# Patient Record
Sex: Female | Born: 1955 | Race: White | Hispanic: No | Marital: Married | Smoking: Former smoker
Health system: Southern US, Community
[De-identification: ages and names within clinical notes are randomized; demographics above are authoritative.]

## PROBLEM LIST (undated history)

## (undated) DIAGNOSIS — I10 Essential (primary) hypertension: Secondary | ICD-10-CM

## (undated) DIAGNOSIS — E7800 Pure hypercholesterolemia, unspecified: Secondary | ICD-10-CM

## (undated) HISTORY — PX: TUBAL LIGATION: SHX77

---

## 2001-10-21 ENCOUNTER — Ambulatory Visit (HOSPITAL_COMMUNITY): Admission: RE | Admit: 2001-10-21 | Discharge: 2001-10-21 | Payer: Self-pay | Admitting: *Deleted

## 2001-10-21 ENCOUNTER — Encounter: Payer: Self-pay | Admitting: Specialist

## 2002-11-08 ENCOUNTER — Encounter: Payer: Self-pay | Admitting: Obstetrics and Gynecology

## 2002-11-08 ENCOUNTER — Ambulatory Visit (HOSPITAL_COMMUNITY): Admission: RE | Admit: 2002-11-08 | Discharge: 2002-11-08 | Payer: Self-pay | Admitting: Obstetrics and Gynecology

## 2002-11-15 ENCOUNTER — Ambulatory Visit (HOSPITAL_COMMUNITY): Admission: RE | Admit: 2002-11-15 | Discharge: 2002-11-15 | Payer: Self-pay | Admitting: Obstetrics and Gynecology

## 2002-11-15 ENCOUNTER — Encounter: Payer: Self-pay | Admitting: Obstetrics and Gynecology

## 2004-04-02 ENCOUNTER — Ambulatory Visit (HOSPITAL_COMMUNITY): Admission: RE | Admit: 2004-04-02 | Discharge: 2004-04-02 | Payer: Self-pay | Admitting: Obstetrics and Gynecology

## 2005-06-24 ENCOUNTER — Ambulatory Visit (HOSPITAL_COMMUNITY): Admission: RE | Admit: 2005-06-24 | Discharge: 2005-06-24 | Payer: Self-pay | Admitting: Obstetrics and Gynecology

## 2005-07-06 ENCOUNTER — Encounter: Admission: RE | Admit: 2005-07-06 | Discharge: 2005-07-06 | Payer: Self-pay | Admitting: Obstetrics and Gynecology

## 2006-09-02 ENCOUNTER — Ambulatory Visit (HOSPITAL_COMMUNITY): Admission: RE | Admit: 2006-09-02 | Discharge: 2006-09-02 | Payer: Self-pay | Admitting: Obstetrics and Gynecology

## 2007-11-01 ENCOUNTER — Ambulatory Visit (HOSPITAL_COMMUNITY): Admission: RE | Admit: 2007-11-01 | Discharge: 2007-11-01 | Payer: Self-pay | Admitting: Obstetrics and Gynecology

## 2009-01-14 ENCOUNTER — Ambulatory Visit (HOSPITAL_COMMUNITY): Admission: RE | Admit: 2009-01-14 | Discharge: 2009-01-14 | Payer: Self-pay | Admitting: Obstetrics and Gynecology

## 2010-04-21 ENCOUNTER — Ambulatory Visit (HOSPITAL_COMMUNITY): Admission: RE | Admit: 2010-04-21 | Discharge: 2010-04-21 | Payer: Self-pay | Admitting: Obstetrics & Gynecology

## 2011-05-19 ENCOUNTER — Other Ambulatory Visit: Payer: Self-pay | Admitting: Obstetrics & Gynecology

## 2011-05-19 DIAGNOSIS — Z139 Encounter for screening, unspecified: Secondary | ICD-10-CM

## 2011-06-01 ENCOUNTER — Ambulatory Visit (HOSPITAL_COMMUNITY)
Admission: RE | Admit: 2011-06-01 | Discharge: 2011-06-01 | Disposition: A | Discharge status: Home / Self Care | Payer: BC Managed Care – PPO | Source: Ambulatory Visit | Attending: Obstetrics & Gynecology | Admitting: Obstetrics & Gynecology

## 2011-06-01 DIAGNOSIS — Z139 Encounter for screening, unspecified: Secondary | ICD-10-CM

## 2011-06-01 DIAGNOSIS — Z1231 Encounter for screening mammogram for malignant neoplasm of breast: Secondary | ICD-10-CM | POA: Insufficient documentation

## 2011-10-14 ENCOUNTER — Encounter (INDEPENDENT_AMBULATORY_CARE_PROVIDER_SITE_OTHER): Payer: BC Managed Care – PPO | Admitting: Internal Medicine

## 2011-10-27 ENCOUNTER — Other Ambulatory Visit (INDEPENDENT_AMBULATORY_CARE_PROVIDER_SITE_OTHER): Payer: Self-pay | Admitting: *Deleted

## 2011-10-27 DIAGNOSIS — Z1211 Encounter for screening for malignant neoplasm of colon: Secondary | ICD-10-CM

## 2011-11-18 ENCOUNTER — Telehealth (INDEPENDENT_AMBULATORY_CARE_PROVIDER_SITE_OTHER): Payer: Self-pay | Admitting: *Deleted

## 2011-11-18 NOTE — Telephone Encounter (Signed)
Agree with colonoscopy.

## 2011-11-18 NOTE — Telephone Encounter (Signed)
PCP/Requesting MD:  Dwana Melena  Name: Cynthia Stephens  DOB: 07/06/2056  Home Phone: [phone]      Procedure: TCS  Reason/Indication:  SCREENING  Has patient had this procedure before?  NO  If so, when, by whom and where?    Is there a family history of colon cancer?  NO  Who?  What age when diagnosed?    Is patient diabetic?   NO      Does patient have prosthetic heart valve?  NO  Do you have a pacemaker?  NO  Has patient had joint replacement within last 12 months?  NO  Is patient on Coumadin, Plavix and/or Aspirin? NO  Medications: LISINOPRIL/HCTZ 20/25 MG DAILY, OSTEO BIFLEX DAILY, VITAMINS, CALTRATE, CALCIUM W/ VIT D  Allergies: NKDA  Pharmacy:   Medication Adjustment: NONE  Procedure date & time: 12/02/11 @ 9:45

## 2011-11-26 ENCOUNTER — Encounter (HOSPITAL_COMMUNITY): Payer: Self-pay | Admitting: Pharmacy Technician

## 2011-12-01 MED ORDER — SODIUM CHLORIDE 0.45 % IV SOLN
Freq: Once | INTRAVENOUS | Status: AC
  Administered 2011-12-02: 09:00:00 via INTRAVENOUS

## 2011-12-02 ENCOUNTER — Encounter (HOSPITAL_COMMUNITY): Payer: Self-pay | Admitting: *Deleted

## 2011-12-02 ENCOUNTER — Encounter (HOSPITAL_COMMUNITY)
Admission: RE | Disposition: A | Discharge status: Home / Self Care | Payer: Self-pay | Source: Ambulatory Visit | Attending: Internal Medicine

## 2011-12-02 ENCOUNTER — Ambulatory Visit (HOSPITAL_COMMUNITY)
Admission: RE | Admit: 2011-12-02 | Discharge: 2011-12-02 | Disposition: A | Discharge status: Home / Self Care | Payer: BC Managed Care – PPO | Source: Ambulatory Visit | Attending: Internal Medicine | Admitting: Internal Medicine

## 2011-12-02 DIAGNOSIS — Z1211 Encounter for screening for malignant neoplasm of colon: Secondary | ICD-10-CM

## 2011-12-02 DIAGNOSIS — K644 Residual hemorrhoidal skin tags: Secondary | ICD-10-CM | POA: Insufficient documentation

## 2011-12-02 HISTORY — DX: Pure hypercholesterolemia, unspecified: E78.00

## 2011-12-02 HISTORY — PX: COLONOSCOPY: SHX5424

## 2011-12-02 HISTORY — DX: Essential (primary) hypertension: I10

## 2011-12-02 SURGERY — COLONOSCOPY
Anesthesia: Moderate Sedation

## 2011-12-02 MED ORDER — MEPERIDINE HCL 50 MG/ML IJ SOLN
INTRAMUSCULAR | Status: AC
  Filled 2011-12-02: qty 1

## 2011-12-02 MED ORDER — MIDAZOLAM HCL 5 MG/5ML IJ SOLN
INTRAMUSCULAR | Status: AC
  Filled 2011-12-02: qty 10

## 2011-12-02 MED ORDER — MEPERIDINE HCL 50 MG/ML IJ SOLN
INTRAMUSCULAR | Status: DC | PRN
  Administered 2011-12-02 (×2): 25 mg via INTRAVENOUS

## 2011-12-02 MED ORDER — STERILE WATER FOR IRRIGATION IR SOLN
Status: DC | PRN
  Administered 2011-12-02: 09:00:00

## 2011-12-02 MED ORDER — MIDAZOLAM HCL 5 MG/5ML IJ SOLN
INTRAMUSCULAR | Status: DC | PRN
  Administered 2011-12-02 (×3): 2 mg via INTRAVENOUS

## 2011-12-02 NOTE — H&P (Signed)
Cynthia Stephens is an 55 y.o. female.   Chief Complaint: Patient is here for colonoscopy. HPI: Patient is 55 year old Caucasian female who is here for average risk screening colonoscopy. She has a good appetite and denies abdominal pain, change in her bowel habits or rectal bleeding. Family history is negative for CRC.  Past Medical History  Diagnosis Date  . Hypertension   . Hypercholesteremia     Past Surgical History  Procedure Date  . Tubal ligation     Family History  Problem Relation Age of Onset  . Colon cancer Neg Hx    Social History:  reports that she has quit smoking. She does not have any smokeless tobacco history on file. She reports that she drinks about 2 ounces of alcohol per week. She reports that she does not use illicit drugs.  Allergies: No Known Allergies  Medications Prior to Admission  Medication Dose Route Frequency Provider Last Rate Last Dose  . 0.45 % sodium chloride infusion   Intravenous Once Malissa Hippo, MD 20 mL/hr at 12/02/11 0858    . meperidine (DEMEROL) 50 MG/ML injection           . midazolam (VERSED) 5 MG/5ML injection            No current outpatient prescriptions on file as of 12/02/2011.    No results found for this or any previous visit (from the past 48 hour(s)). No results found.  Review of Systems  Constitutional: Negative for weight loss.  Gastrointestinal: Negative for abdominal pain, diarrhea, constipation, blood in stool and melena.    Blood pressure 130/80, pulse 92, temperature 97.8 F (36.6 C), temperature source Oral, resp. rate 24, height 5\' 3"  (1.6 m), weight 180 lb (81.647 kg), SpO2 99.00%. Physical Exam  Constitutional: She appears well-developed and well-nourished.  HENT:  Mouth/Throat: Oropharynx is clear and moist.  Eyes: Conjunctivae are normal. No scleral icterus.  Neck: No thyromegaly present.  Cardiovascular: Normal rate, regular rhythm and normal heart sounds.   No murmur heard. Respiratory: Effort  normal and breath sounds normal.  GI: Soft. She exhibits no distension and no mass. There is no tenderness.  Musculoskeletal: She exhibits no edema.  Lymphadenopathy:    She has no cervical adenopathy.  Neurological: She is alert.  Skin: Skin is warm and dry.     Assessment/Plan Average risk screening colonoscopy.  Christyanna Mckeon U 12/02/2011, 9:16 AM

## 2011-12-02 NOTE — Op Note (Signed)
COLONOSCOPY PROCEDURE REPORT  PATIENT:  Cynthia Stephens  MR#:  [id number] Birthdate:  October 12, 1956, 55 y.o., female Endoscopist:  Dr. Malissa Hippo, MD Referred By:  Dr. Dwana Melena, MD Procedure Date: 12/02/2011  Procedure:   Colonoscopy  Indications:  Patient is 55 year old Caucasian female who is undergoing average risk screening colonoscopy.  Informed Consent:  Procedure and risks were reviewed with the patient and informed consent obtained.  Medications:  Demerol 50 mg IV Versed 6 mg IV  Description of procedure:  After a digital rectal exam was performed, that colonoscope was advanced from the anus through the rectum and colon to the area of the cecum, ileocecal valve and appendiceal orifice. The cecum was deeply intubated. These structures were well-seen and photographed for the record. From the level of the cecum and ileocecal valve, the scope was slowly and cautiously withdrawn. The mucosal surfaces were carefully surveyed utilizing scope tip to flexion to facilitate fold flattening as needed. The scope was pulled down into the rectum where a thorough exam including retroflexion was performed.  Findings:   Prep excellent. Normal mucosa of the colon and rectum. Small hemorrhoids below the dentate line.  Therapeutic/Diagnostic Maneuvers Performed:  None  Complications:  None  Cecal Withdrawal Time:  11 minutes  Impression:  Normal colonoscopy except small external hemorrhoids.  Recommendations:  Standard instructions given. Next screening exam in 10 years.  Towana Stenglein U  12/02/2011 9:42 AM  CC: Dr. Dwana Melena, MD & Dr. Bonnetta Barry ref. provider found

## 2011-12-17 ENCOUNTER — Encounter (HOSPITAL_COMMUNITY): Payer: Self-pay | Admitting: Internal Medicine

## 2012-08-02 ENCOUNTER — Other Ambulatory Visit: Payer: Self-pay | Admitting: Obstetrics & Gynecology

## 2012-08-02 DIAGNOSIS — Z139 Encounter for screening, unspecified: Secondary | ICD-10-CM

## 2012-08-12 ENCOUNTER — Ambulatory Visit (HOSPITAL_COMMUNITY)
Admission: RE | Admit: 2012-08-12 | Discharge: 2012-08-12 | Disposition: A | Discharge status: Home / Self Care | Payer: BC Managed Care – PPO | Source: Ambulatory Visit | Attending: Obstetrics & Gynecology | Admitting: Obstetrics & Gynecology

## 2012-08-12 DIAGNOSIS — Z1231 Encounter for screening mammogram for malignant neoplasm of breast: Secondary | ICD-10-CM | POA: Insufficient documentation

## 2012-08-12 DIAGNOSIS — Z139 Encounter for screening, unspecified: Secondary | ICD-10-CM

## 2013-10-19 ENCOUNTER — Other Ambulatory Visit: Payer: Self-pay | Admitting: Adult Health

## 2013-10-19 ENCOUNTER — Other Ambulatory Visit: Payer: Self-pay | Admitting: Obstetrics & Gynecology

## 2013-10-19 DIAGNOSIS — Z139 Encounter for screening, unspecified: Secondary | ICD-10-CM

## 2013-10-24 ENCOUNTER — Ambulatory Visit (HOSPITAL_COMMUNITY)
Admission: RE | Admit: 2013-10-24 | Discharge: 2013-10-24 | Disposition: A | Discharge status: Home / Self Care | Payer: BC Managed Care – PPO | Source: Ambulatory Visit | Attending: Adult Health | Admitting: Adult Health

## 2013-10-24 DIAGNOSIS — Z139 Encounter for screening, unspecified: Secondary | ICD-10-CM

## 2013-10-24 DIAGNOSIS — Z1231 Encounter for screening mammogram for malignant neoplasm of breast: Secondary | ICD-10-CM | POA: Insufficient documentation

## 2015-03-11 ENCOUNTER — Other Ambulatory Visit: Payer: Self-pay | Admitting: Adult Health

## 2015-03-11 DIAGNOSIS — Z1231 Encounter for screening mammogram for malignant neoplasm of breast: Secondary | ICD-10-CM

## 2015-03-25 ENCOUNTER — Ambulatory Visit (HOSPITAL_COMMUNITY)
Admission: RE | Admit: 2015-03-25 | Discharge: 2015-03-25 | Disposition: A | Discharge status: Home / Self Care | Payer: BC Managed Care – PPO | Source: Ambulatory Visit | Attending: Adult Health | Admitting: Adult Health

## 2015-03-25 DIAGNOSIS — Z1231 Encounter for screening mammogram for malignant neoplasm of breast: Secondary | ICD-10-CM | POA: Insufficient documentation

## 2015-05-01 ENCOUNTER — Ambulatory Visit (HOSPITAL_COMMUNITY): Payer: BC Managed Care – PPO

## 2016-10-12 ENCOUNTER — Other Ambulatory Visit (HOSPITAL_COMMUNITY): Payer: Self-pay | Admitting: Internal Medicine

## 2016-10-12 DIAGNOSIS — Z1231 Encounter for screening mammogram for malignant neoplasm of breast: Secondary | ICD-10-CM

## 2016-10-12 DIAGNOSIS — Z13820 Encounter for screening for osteoporosis: Secondary | ICD-10-CM

## 2016-10-26 ENCOUNTER — Ambulatory Visit (HOSPITAL_COMMUNITY)
Admission: RE | Admit: 2016-10-26 | Discharge: 2016-10-26 | Disposition: A | Discharge status: Home / Self Care | Payer: BC Managed Care – PPO | Source: Ambulatory Visit | Attending: Internal Medicine | Admitting: Internal Medicine

## 2016-10-26 DIAGNOSIS — Z1231 Encounter for screening mammogram for malignant neoplasm of breast: Secondary | ICD-10-CM

## 2017-05-20 ENCOUNTER — Other Ambulatory Visit (HOSPITAL_COMMUNITY): Payer: BC Managed Care – PPO

## 2017-05-27 ENCOUNTER — Other Ambulatory Visit (HOSPITAL_COMMUNITY): Payer: BC Managed Care – PPO

## 2017-06-16 ENCOUNTER — Other Ambulatory Visit (HOSPITAL_COMMUNITY): Payer: BC Managed Care – PPO

## 2017-06-30 ENCOUNTER — Ambulatory Visit (HOSPITAL_COMMUNITY)
Admission: RE | Admit: 2017-06-30 | Discharge: 2017-06-30 | Disposition: A | Discharge status: Home / Self Care | Payer: BC Managed Care – PPO | Source: Ambulatory Visit | Attending: Internal Medicine | Admitting: Internal Medicine

## 2017-06-30 DIAGNOSIS — Z13820 Encounter for screening for osteoporosis: Secondary | ICD-10-CM | POA: Insufficient documentation

## 2017-06-30 DIAGNOSIS — Z780 Asymptomatic menopausal state: Secondary | ICD-10-CM | POA: Diagnosis not present

## 2017-06-30 DIAGNOSIS — M8580 Other specified disorders of bone density and structure, unspecified site: Secondary | ICD-10-CM | POA: Insufficient documentation

## 2018-03-24 ENCOUNTER — Other Ambulatory Visit (HOSPITAL_COMMUNITY): Payer: Self-pay | Admitting: Internal Medicine

## 2018-03-24 DIAGNOSIS — Z1231 Encounter for screening mammogram for malignant neoplasm of breast: Secondary | ICD-10-CM

## 2018-04-07 ENCOUNTER — Ambulatory Visit (HOSPITAL_COMMUNITY)
Admission: RE | Admit: 2018-04-07 | Discharge: 2018-04-07 | Disposition: A | Discharge status: Home / Self Care | Payer: BC Managed Care – PPO | Source: Ambulatory Visit | Attending: Internal Medicine | Admitting: Internal Medicine

## 2018-04-07 DIAGNOSIS — Z1231 Encounter for screening mammogram for malignant neoplasm of breast: Secondary | ICD-10-CM | POA: Diagnosis present

## 2019-05-25 ENCOUNTER — Other Ambulatory Visit (HOSPITAL_COMMUNITY): Payer: Self-pay | Admitting: Internal Medicine

## 2019-05-25 DIAGNOSIS — Z1231 Encounter for screening mammogram for malignant neoplasm of breast: Secondary | ICD-10-CM

## 2019-06-01 ENCOUNTER — Ambulatory Visit (HOSPITAL_COMMUNITY)
Admission: RE | Admit: 2019-06-01 | Discharge: 2019-06-01 | Disposition: A | Discharge status: Home / Self Care | Payer: BC Managed Care – PPO | Source: Ambulatory Visit | Attending: Internal Medicine | Admitting: Internal Medicine

## 2019-06-01 ENCOUNTER — Other Ambulatory Visit: Payer: Self-pay

## 2019-06-01 DIAGNOSIS — Z1231 Encounter for screening mammogram for malignant neoplasm of breast: Secondary | ICD-10-CM | POA: Insufficient documentation

## 2019-12-25 ENCOUNTER — Other Ambulatory Visit: Payer: Self-pay

## 2019-12-25 ENCOUNTER — Ambulatory Visit: Payer: Medicare PPO | Attending: Internal Medicine

## 2019-12-25 DIAGNOSIS — Z20822 Contact with and (suspected) exposure to covid-19: Secondary | ICD-10-CM

## 2019-12-26 LAB — NOVEL CORONAVIRUS, NAA: SARS-CoV-2, NAA: NOT DETECTED

## 2020-01-13 ENCOUNTER — Ambulatory Visit: Payer: Medicare PPO | Attending: Internal Medicine

## 2020-01-13 DIAGNOSIS — Z23 Encounter for immunization: Secondary | ICD-10-CM | POA: Insufficient documentation

## 2020-01-13 NOTE — Progress Notes (Signed)
   Covid-19 Vaccination Clinic  Name:  Cynthia Stephens    MRN: [id number] DOB: 09/09/1956  01/13/2020  [name] was observed post Covid-19 immunization for 15 minutes without incidence. She was provided with Vaccine Information Sheet and instruction to access the V-Safe system.   [name] was instructed to call 911 with any severe reactions post vaccine: Marland Kitchen Difficulty breathing  . Swelling of your face and throat  . A fast heartbeat  . A bad rash all over your body  . Dizziness and weakness    Immunizations Administered    Name Date Dose VIS Date Route   Pfizer COVID-19 Vaccine 01/13/2020 10:09 AM 0.3 mL 12/01/2019 Intramuscular   Manufacturer: [employer], [employer]   Lot: [id number]   NDC: [address]-0

## 2020-01-23 ENCOUNTER — Ambulatory Visit: Payer: Medicare PPO

## 2020-01-30 ENCOUNTER — Ambulatory Visit: Payer: Medicare PPO

## 2020-02-01 ENCOUNTER — Ambulatory Visit: Payer: Medicare PPO

## 2020-02-03 ENCOUNTER — Ambulatory Visit: Payer: Medicare PPO | Attending: Internal Medicine

## 2020-02-03 DIAGNOSIS — Z23 Encounter for immunization: Secondary | ICD-10-CM

## 2020-02-03 NOTE — Progress Notes (Signed)
   Covid-19 Vaccination Clinic  Name:  RANEE PEASLEY    MRN: [id number] DOB: [birth year]  02/03/2020  Ms. Deoliveira was observed post Covid-19 immunization for 15 minutes without incidence. She was provided with Vaccine Information Sheet and instruction to access the V-Safe system.   Ms. Blattner was instructed to call 911 with any severe reactions post vaccine: Marland Kitchen Difficulty breathing  . Swelling of your face and throat  . A fast heartbeat  . A bad rash all over your body  . Dizziness and weakness    Immunizations Administered    Name Date Dose VIS Date Route   Pfizer COVID-19 Vaccine 02/03/2020 11:59 AM 0.3 mL 12/01/2019 Intramuscular   Manufacturer: [employer], [employer]   Lot: [id number]   NDC: [address]-1

## 2020-03-29 DIAGNOSIS — I10 Essential (primary) hypertension: Secondary | ICD-10-CM | POA: Diagnosis not present

## 2020-03-29 DIAGNOSIS — Z0000 Encounter for general adult medical examination without abnormal findings: Secondary | ICD-10-CM | POA: Diagnosis not present

## 2020-03-29 DIAGNOSIS — E785 Hyperlipidemia, unspecified: Secondary | ICD-10-CM | POA: Diagnosis not present

## 2020-03-29 DIAGNOSIS — E669 Obesity, unspecified: Secondary | ICD-10-CM | POA: Diagnosis not present

## 2020-03-29 DIAGNOSIS — Z23 Encounter for immunization: Secondary | ICD-10-CM | POA: Diagnosis not present

## 2020-03-29 DIAGNOSIS — E6609 Other obesity due to excess calories: Secondary | ICD-10-CM | POA: Diagnosis not present

## 2020-03-29 DIAGNOSIS — Z0001 Encounter for general adult medical examination with abnormal findings: Secondary | ICD-10-CM | POA: Diagnosis not present

## 2020-03-29 DIAGNOSIS — E782 Mixed hyperlipidemia: Secondary | ICD-10-CM | POA: Diagnosis not present

## 2020-03-29 DIAGNOSIS — R7301 Impaired fasting glucose: Secondary | ICD-10-CM | POA: Diagnosis not present

## 2020-04-02 DIAGNOSIS — E782 Mixed hyperlipidemia: Secondary | ICD-10-CM | POA: Diagnosis not present

## 2020-04-02 DIAGNOSIS — Z8619 Personal history of other infectious and parasitic diseases: Secondary | ICD-10-CM | POA: Diagnosis not present

## 2020-04-02 DIAGNOSIS — Z6831 Body mass index (BMI) 31.0-31.9, adult: Secondary | ICD-10-CM | POA: Diagnosis not present

## 2020-04-02 DIAGNOSIS — Z713 Dietary counseling and surveillance: Secondary | ICD-10-CM | POA: Diagnosis not present

## 2020-04-02 DIAGNOSIS — E785 Hyperlipidemia, unspecified: Secondary | ICD-10-CM | POA: Diagnosis not present

## 2020-04-02 DIAGNOSIS — Z136 Encounter for screening for cardiovascular disorders: Secondary | ICD-10-CM | POA: Diagnosis not present

## 2020-04-02 DIAGNOSIS — E669 Obesity, unspecified: Secondary | ICD-10-CM | POA: Diagnosis not present

## 2020-04-02 DIAGNOSIS — Z0001 Encounter for general adult medical examination with abnormal findings: Secondary | ICD-10-CM | POA: Diagnosis not present

## 2020-04-02 DIAGNOSIS — R7301 Impaired fasting glucose: Secondary | ICD-10-CM | POA: Diagnosis not present

## 2020-04-02 DIAGNOSIS — Z6835 Body mass index (BMI) 35.0-35.9, adult: Secondary | ICD-10-CM | POA: Diagnosis not present

## 2020-04-02 DIAGNOSIS — Z23 Encounter for immunization: Secondary | ICD-10-CM | POA: Diagnosis not present

## 2020-04-02 DIAGNOSIS — I10 Essential (primary) hypertension: Secondary | ICD-10-CM | POA: Diagnosis not present

## 2020-08-29 ENCOUNTER — Ambulatory Visit: Payer: Medicare PPO | Attending: Internal Medicine

## 2020-08-29 DIAGNOSIS — Z23 Encounter for immunization: Secondary | ICD-10-CM

## 2020-08-29 NOTE — Progress Notes (Signed)
   Covid-19 Vaccination Clinic  Name:  Cynthia Stephens    MRN: [id number] DOB: [birth year]  08/29/2020  Cynthia Stephens was observed post Covid-19 immunization for 15 minutes without incident. She was provided with Vaccine Information Sheet and instruction to access the V-Safe system.   Cynthia Stephens was instructed to call 911 with any severe reactions post vaccine: Marland Kitchen Difficulty breathing  . Swelling of face and throat  . A fast heartbeat  . A bad rash all over body  . Dizziness and weakness

## 2020-10-10 DIAGNOSIS — E782 Mixed hyperlipidemia: Secondary | ICD-10-CM | POA: Diagnosis not present

## 2020-10-10 DIAGNOSIS — Z0001 Encounter for general adult medical examination with abnormal findings: Secondary | ICD-10-CM | POA: Diagnosis not present

## 2020-10-10 DIAGNOSIS — Z0000 Encounter for general adult medical examination without abnormal findings: Secondary | ICD-10-CM | POA: Diagnosis not present

## 2020-10-10 DIAGNOSIS — Z136 Encounter for screening for cardiovascular disorders: Secondary | ICD-10-CM | POA: Diagnosis not present

## 2020-10-10 DIAGNOSIS — E669 Obesity, unspecified: Secondary | ICD-10-CM | POA: Diagnosis not present

## 2020-10-10 DIAGNOSIS — E785 Hyperlipidemia, unspecified: Secondary | ICD-10-CM | POA: Diagnosis not present

## 2020-10-10 DIAGNOSIS — Z8619 Personal history of other infectious and parasitic diseases: Secondary | ICD-10-CM | POA: Diagnosis not present

## 2020-10-10 DIAGNOSIS — I10 Essential (primary) hypertension: Secondary | ICD-10-CM | POA: Diagnosis not present

## 2020-10-10 DIAGNOSIS — Z6831 Body mass index (BMI) 31.0-31.9, adult: Secondary | ICD-10-CM | POA: Diagnosis not present

## 2020-10-16 DIAGNOSIS — Z23 Encounter for immunization: Secondary | ICD-10-CM | POA: Diagnosis not present

## 2020-10-16 DIAGNOSIS — E669 Obesity, unspecified: Secondary | ICD-10-CM | POA: Diagnosis not present

## 2020-10-16 DIAGNOSIS — R7301 Impaired fasting glucose: Secondary | ICD-10-CM | POA: Diagnosis not present

## 2020-10-16 DIAGNOSIS — Z6830 Body mass index (BMI) 30.0-30.9, adult: Secondary | ICD-10-CM | POA: Diagnosis not present

## 2020-10-16 DIAGNOSIS — E782 Mixed hyperlipidemia: Secondary | ICD-10-CM | POA: Diagnosis not present

## 2020-10-16 DIAGNOSIS — I10 Essential (primary) hypertension: Secondary | ICD-10-CM | POA: Diagnosis not present

## 2020-12-04 DIAGNOSIS — H43811 Vitreous degeneration, right eye: Secondary | ICD-10-CM | POA: Diagnosis not present

## 2020-12-04 DIAGNOSIS — H353131 Nonexudative age-related macular degeneration, bilateral, early dry stage: Secondary | ICD-10-CM | POA: Diagnosis not present

## 2020-12-04 DIAGNOSIS — H33311 Horseshoe tear of retina without detachment, right eye: Secondary | ICD-10-CM | POA: Diagnosis not present

## 2021-01-24 ENCOUNTER — Ambulatory Visit (HOSPITAL_COMMUNITY)
Admission: RE | Admit: 2021-01-24 | Discharge: 2021-01-24 | Disposition: A | Discharge status: Home / Self Care | Payer: Medicare PPO | Source: Ambulatory Visit | Attending: Internal Medicine | Admitting: Internal Medicine

## 2021-01-24 ENCOUNTER — Other Ambulatory Visit: Payer: Self-pay

## 2021-01-24 ENCOUNTER — Other Ambulatory Visit (HOSPITAL_COMMUNITY): Payer: Self-pay | Admitting: Internal Medicine

## 2021-01-24 DIAGNOSIS — Z1231 Encounter for screening mammogram for malignant neoplasm of breast: Secondary | ICD-10-CM | POA: Diagnosis not present

## 2021-03-27 DIAGNOSIS — Z6835 Body mass index (BMI) 35.0-35.9, adult: Secondary | ICD-10-CM | POA: Diagnosis not present

## 2021-03-27 DIAGNOSIS — E785 Hyperlipidemia, unspecified: Secondary | ICD-10-CM | POA: Diagnosis not present

## 2021-03-27 DIAGNOSIS — I10 Essential (primary) hypertension: Secondary | ICD-10-CM | POA: Diagnosis not present

## 2021-03-27 DIAGNOSIS — E782 Mixed hyperlipidemia: Secondary | ICD-10-CM | POA: Diagnosis not present

## 2021-03-27 DIAGNOSIS — R7301 Impaired fasting glucose: Secondary | ICD-10-CM | POA: Diagnosis not present

## 2021-03-27 DIAGNOSIS — Z79899 Other long term (current) drug therapy: Secondary | ICD-10-CM | POA: Diagnosis not present

## 2021-03-27 DIAGNOSIS — E669 Obesity, unspecified: Secondary | ICD-10-CM | POA: Diagnosis not present

## 2021-04-02 ENCOUNTER — Ambulatory Visit: Payer: Medicare PPO | Attending: Internal Medicine

## 2021-04-02 DIAGNOSIS — Z23 Encounter for immunization: Secondary | ICD-10-CM

## 2021-04-02 DIAGNOSIS — Z6830 Body mass index (BMI) 30.0-30.9, adult: Secondary | ICD-10-CM | POA: Diagnosis not present

## 2021-04-02 DIAGNOSIS — R7301 Impaired fasting glucose: Secondary | ICD-10-CM | POA: Diagnosis not present

## 2021-04-02 DIAGNOSIS — I10 Essential (primary) hypertension: Secondary | ICD-10-CM | POA: Diagnosis not present

## 2021-04-02 DIAGNOSIS — E669 Obesity, unspecified: Secondary | ICD-10-CM | POA: Diagnosis not present

## 2021-04-02 DIAGNOSIS — E782 Mixed hyperlipidemia: Secondary | ICD-10-CM | POA: Diagnosis not present

## 2021-04-02 NOTE — Progress Notes (Signed)
   Covid-19 Vaccination Clinic  Name:  BRITTAN BUTTERBAUGH    MRN: [id number] DOB: 1956-10-20  04/02/2021  Ms. Reicks was observed post Covid-19 immunization for 15 minutes without incident. She was provided with Vaccine Information Sheet and instruction to access the V-Safe system.   Ms. Methvin was instructed to call 911 with any severe reactions post vaccine: Marland Kitchen Difficulty breathing  . Swelling of face and throat  . A fast heartbeat  . A bad rash all over body  . Dizziness and weakness   Immunizations Administered    Name Date Dose VIS Date Route   PFIZER Comrnaty(Gray TOP) Covid-19 Vaccine 04/02/2021 11:08 AM 0.3 mL 11/28/2020 Intramuscular   Manufacturer: [employer], [employer]   Lot: [id number]   NDC: [phone]

## 2021-04-07 ENCOUNTER — Other Ambulatory Visit (HOSPITAL_COMMUNITY): Payer: Self-pay

## 2021-04-07 MED ORDER — COVID-19 MRNA VAC-TRIS(PFIZER) 30 MCG/0.3ML IM SUSP
INTRAMUSCULAR | 0 refills | Status: DC
  Filled 2021-04-07: qty 0.3, 17d supply, fill #0

## 2021-04-08 ENCOUNTER — Other Ambulatory Visit (HOSPITAL_COMMUNITY): Payer: Self-pay

## 2021-04-09 DIAGNOSIS — H353131 Nonexudative age-related macular degeneration, bilateral, early dry stage: Secondary | ICD-10-CM | POA: Diagnosis not present

## 2021-04-09 DIAGNOSIS — H33311 Horseshoe tear of retina without detachment, right eye: Secondary | ICD-10-CM | POA: Diagnosis not present

## 2021-09-27 DIAGNOSIS — Z833 Family history of diabetes mellitus: Secondary | ICD-10-CM | POA: Diagnosis not present

## 2021-09-27 DIAGNOSIS — I10 Essential (primary) hypertension: Secondary | ICD-10-CM | POA: Diagnosis not present

## 2021-09-27 DIAGNOSIS — E669 Obesity, unspecified: Secondary | ICD-10-CM | POA: Diagnosis not present

## 2021-09-27 DIAGNOSIS — E785 Hyperlipidemia, unspecified: Secondary | ICD-10-CM | POA: Diagnosis not present

## 2021-09-27 DIAGNOSIS — Z87891 Personal history of nicotine dependence: Secondary | ICD-10-CM | POA: Diagnosis not present

## 2021-09-27 DIAGNOSIS — Z8249 Family history of ischemic heart disease and other diseases of the circulatory system: Secondary | ICD-10-CM | POA: Diagnosis not present

## 2021-09-27 DIAGNOSIS — M1990 Unspecified osteoarthritis, unspecified site: Secondary | ICD-10-CM | POA: Diagnosis not present

## 2021-10-02 DIAGNOSIS — E669 Obesity, unspecified: Secondary | ICD-10-CM | POA: Diagnosis not present

## 2021-10-02 DIAGNOSIS — Z0000 Encounter for general adult medical examination without abnormal findings: Secondary | ICD-10-CM | POA: Diagnosis not present

## 2021-10-02 DIAGNOSIS — E782 Mixed hyperlipidemia: Secondary | ICD-10-CM | POA: Diagnosis not present

## 2021-10-14 DIAGNOSIS — Z0001 Encounter for general adult medical examination with abnormal findings: Secondary | ICD-10-CM | POA: Diagnosis not present

## 2021-10-14 DIAGNOSIS — E782 Mixed hyperlipidemia: Secondary | ICD-10-CM | POA: Diagnosis not present

## 2021-10-14 DIAGNOSIS — Z23 Encounter for immunization: Secondary | ICD-10-CM | POA: Diagnosis not present

## 2021-10-14 DIAGNOSIS — I10 Essential (primary) hypertension: Secondary | ICD-10-CM | POA: Diagnosis not present

## 2021-10-14 DIAGNOSIS — R7301 Impaired fasting glucose: Secondary | ICD-10-CM | POA: Diagnosis not present

## 2021-10-14 DIAGNOSIS — E669 Obesity, unspecified: Secondary | ICD-10-CM | POA: Diagnosis not present

## 2021-10-15 ENCOUNTER — Other Ambulatory Visit (HOSPITAL_COMMUNITY): Payer: Self-pay | Admitting: Internal Medicine

## 2021-10-15 DIAGNOSIS — E669 Obesity, unspecified: Secondary | ICD-10-CM

## 2021-10-22 ENCOUNTER — Other Ambulatory Visit: Payer: Self-pay

## 2021-10-22 ENCOUNTER — Ambulatory Visit (HOSPITAL_COMMUNITY)
Admission: RE | Admit: 2021-10-22 | Discharge: 2021-10-22 | Disposition: A | Discharge status: Home / Self Care | Payer: Medicare PPO | Source: Ambulatory Visit | Attending: Internal Medicine | Admitting: Internal Medicine

## 2021-10-22 DIAGNOSIS — Z780 Asymptomatic menopausal state: Secondary | ICD-10-CM | POA: Diagnosis not present

## 2021-10-22 DIAGNOSIS — Z13820 Encounter for screening for osteoporosis: Secondary | ICD-10-CM | POA: Diagnosis not present

## 2021-10-22 DIAGNOSIS — E669 Obesity, unspecified: Secondary | ICD-10-CM | POA: Diagnosis not present

## 2021-10-22 DIAGNOSIS — M85851 Other specified disorders of bone density and structure, right thigh: Secondary | ICD-10-CM | POA: Insufficient documentation

## 2021-11-17 ENCOUNTER — Encounter (INDEPENDENT_AMBULATORY_CARE_PROVIDER_SITE_OTHER): Payer: Self-pay | Admitting: *Deleted

## 2022-01-07 DIAGNOSIS — R7301 Impaired fasting glucose: Secondary | ICD-10-CM | POA: Diagnosis not present

## 2022-01-07 DIAGNOSIS — E782 Mixed hyperlipidemia: Secondary | ICD-10-CM | POA: Diagnosis not present

## 2022-01-14 DIAGNOSIS — I10 Essential (primary) hypertension: Secondary | ICD-10-CM | POA: Diagnosis not present

## 2022-01-14 DIAGNOSIS — M85851 Other specified disorders of bone density and structure, right thigh: Secondary | ICD-10-CM | POA: Diagnosis not present

## 2022-01-14 DIAGNOSIS — E782 Mixed hyperlipidemia: Secondary | ICD-10-CM | POA: Diagnosis not present

## 2022-01-14 DIAGNOSIS — E669 Obesity, unspecified: Secondary | ICD-10-CM | POA: Diagnosis not present

## 2022-01-14 DIAGNOSIS — R7301 Impaired fasting glucose: Secondary | ICD-10-CM | POA: Diagnosis not present

## 2022-01-28 DIAGNOSIS — H43812 Vitreous degeneration, left eye: Secondary | ICD-10-CM | POA: Diagnosis not present

## 2022-02-12 ENCOUNTER — Encounter (INDEPENDENT_AMBULATORY_CARE_PROVIDER_SITE_OTHER): Payer: Self-pay | Admitting: *Deleted

## 2022-02-13 ENCOUNTER — Telehealth (INDEPENDENT_AMBULATORY_CARE_PROVIDER_SITE_OTHER): Payer: Self-pay | Admitting: *Deleted

## 2022-02-13 ENCOUNTER — Encounter (INDEPENDENT_AMBULATORY_CARE_PROVIDER_SITE_OTHER): Payer: Self-pay | Admitting: *Deleted

## 2022-02-13 ENCOUNTER — Other Ambulatory Visit (INDEPENDENT_AMBULATORY_CARE_PROVIDER_SITE_OTHER): Payer: Self-pay

## 2022-02-13 DIAGNOSIS — Z1211 Encounter for screening for malignant neoplasm of colon: Secondary | ICD-10-CM

## 2022-02-13 MED ORDER — PEG 3350-KCL-NA BICARB-NACL 420 G PO SOLR: 4000.0000 mL | Freq: Once | ORAL | 0 refills | Status: AC

## 2022-02-13 NOTE — Telephone Encounter (Signed)
Patient needs trilyte 

## 2022-02-19 ENCOUNTER — Telehealth (INDEPENDENT_AMBULATORY_CARE_PROVIDER_SITE_OTHER): Payer: Self-pay | Admitting: *Deleted

## 2022-02-19 NOTE — Telephone Encounter (Signed)
Referring MD/PCP: hall ? ?Procedure: tcs ? ?Reason/Indication:  screening ? ?Has patient had this procedure before?  Yes, 11/2011 ? If so, when, by whom and where?   ? ?Is there a family history of colon cancer?  no ? Who?  What age when diagnosed?   ? ?Is patient diabetic? If yes, Type 1 or Type 2   no ?     ?Does patient have prosthetic heart valve or mechanical valve?  no ? ?Do you have a pacemaker/defibrillator?  no ? ?Has patient ever had endocarditis/atrial fibrillation? no ? ?Does patient use oxygen? no ? ?Has patient had joint replacement within last 12 months?  no ? ?Is patient constipated or do they take laxatives? no ? ?Does patient have a history of alcohol/drug use?  no ? ?Have you had a stroke/heart attack last 6 mths? no ? ?Do you take medicine for weight loss?  no ? ?For female patients,: have you had a hysterectomy  ?                     are you post menopausal  ?                     do you still have your menstrual cycle no ? ?Is patient on blood thinner such as Coumadin, Plavix and/or Aspirin? no ? ?Medications: lisinopril/hctz 20/25 mg daily, rosuvastatin 10 mg daily, mvi daily ? ?Allergies nkda ? ?Medication Adjustment per Dr Rehman/Dr Levon Hedger  ? ?Procedure date & time: 03/18/22 ? ? ?

## 2022-03-11 NOTE — Patient Instructions (Signed)
?   Your procedure is scheduled on: 03/18/2022 ? Report to [facility] Main Entrance at     12:30 PM. ? Call this number if you have problems the morning of surgery: [phone] ? ? Remember: ? ?            Follow Directions on the letter you received from Your Physician's office regarding the Bowel Prep ? ?            No Smoking the day of Procedure : ? ? Take these medicines the morning of surgery with A SIP OF WATER: none ? ? Do not wear jewelry, make-up or nail polish. ?  ? Do not bring valuables to the hospital. ? Contacts, dentures or bridgework may not be worn into surgery. ? . ? ? Patients discharged the day of surgery will not be allowed to drive home. ?  ?  ?Colonoscopy, Adult, Care After ?This sheet gives you information about how to care for yourself after your procedure. Your health care provider may also give you more specific instructions. If you have problems or questions, contact your health care provider. ?What can I expect after the procedure? ?After the procedure, it is common to have: ?A small amount of blood in your stool for 24 hours after the procedure. ?Some gas. ?Mild abdominal cramping or bloating. ? ?Follow these instructions at home: ?General instructions ? ?For the first 24 hours after the procedure: ?Do not drive or use machinery. ?Do not sign important documents. ?Do not drink alcohol. ?Do your regular daily activities at a slower pace than normal. ?Eat soft, easy-to-digest foods. ?Rest often. ?Take over-the-counter or prescription medicines only as told by your health care provider. ?It is up to you to get the results of your procedure. Ask your health care provider, or the department performing the procedure, when your results will be ready. ?Relieving cramping and bloating ?Try walking around when you have cramps or feel bloated. ?Apply heat to your abdomen as told by your health care provider. Use a heat source that your health care provider recommends, such as a moist heat pack or  a heating pad. ?Place a towel between your skin and the heat source. ?Leave the heat on for 20-30 minutes. ?Remove the heat if your skin turns bright red. This is especially important if you are unable to feel pain, heat, or cold. You may have a greater risk of getting burned. ?Eating and drinking ?Drink enough fluid to keep your urine clear or pale yellow. ?Resume your normal diet as instructed by your health care provider. Avoid heavy or fried foods that are hard to digest. ?Avoid drinking alcohol for as long as instructed by your health care provider. ?Contact a health care provider if: ?You have blood in your stool 2-3 days after the procedure. ?Get help right away if: ?You have more than a small spotting of blood in your stool. ?You pass large blood clots in your stool. ?Your abdomen is swollen. ?You have nausea or vomiting. ?You have a fever. ?You have increasing abdominal pain that is not relieved with medicine. ?This information is not intended to replace advice given to you by your health care provider. Make sure you discuss any questions you have with your health care provider. ?Document Released: 07/21/2004 Document Revised: 08/31/2016 Document Reviewed: 02/18/2016 ?Elsevier Interactive Patient Education ? 2018 Elsevier Inc.  ?

## 2022-03-13 DIAGNOSIS — H353131 Nonexudative age-related macular degeneration, bilateral, early dry stage: Secondary | ICD-10-CM | POA: Diagnosis not present

## 2022-03-13 DIAGNOSIS — H43813 Vitreous degeneration, bilateral: Secondary | ICD-10-CM | POA: Diagnosis not present

## 2022-03-13 DIAGNOSIS — H33311 Horseshoe tear of retina without detachment, right eye: Secondary | ICD-10-CM | POA: Diagnosis not present

## 2022-03-16 ENCOUNTER — Encounter (HOSPITAL_COMMUNITY)
Admission: RE | Admit: 2022-03-16 | Discharge: 2022-03-16 | Disposition: A | Discharge status: Home / Self Care | Payer: Medicare PPO | Source: Ambulatory Visit | Attending: Internal Medicine | Admitting: Internal Medicine

## 2022-03-16 ENCOUNTER — Encounter (HOSPITAL_COMMUNITY): Payer: Self-pay

## 2022-03-16 DIAGNOSIS — Z01818 Encounter for other preprocedural examination: Secondary | ICD-10-CM | POA: Insufficient documentation

## 2022-03-16 DIAGNOSIS — Z1211 Encounter for screening for malignant neoplasm of colon: Secondary | ICD-10-CM | POA: Insufficient documentation

## 2022-03-16 LAB — BASIC METABOLIC PANEL
Anion gap: 8 (ref 5–15)
BUN: 27 mg/dL — ABNORMAL HIGH (ref 8–23)
CO2: 28 mmol/L (ref 22–32)
Calcium: 9 mg/dL (ref 8.9–10.3)
Chloride: 102 mmol/L (ref 98–111)
Creatinine, Ser: 0.77 mg/dL (ref 0.44–1.00)
GFR, Estimated: >60 mL/min (ref >60)
Glucose, Bld: 107 mg/dL — ABNORMAL HIGH (ref 70–99)
Potassium: 3.5 mmol/L (ref 3.5–5.1)
Sodium: 138 mmol/L (ref 135–145)

## 2022-03-18 ENCOUNTER — Ambulatory Visit (HOSPITAL_COMMUNITY): Payer: Medicare PPO | Admitting: Certified Registered Nurse Anesthetist

## 2022-03-18 ENCOUNTER — Ambulatory Visit (HOSPITAL_COMMUNITY)
Admission: RE | Admit: 2022-03-18 | Discharge: 2022-03-18 | Disposition: A | Discharge status: Home / Self Care | Payer: Medicare PPO | Attending: Internal Medicine | Admitting: Internal Medicine

## 2022-03-18 ENCOUNTER — Other Ambulatory Visit: Payer: Self-pay

## 2022-03-18 ENCOUNTER — Encounter (HOSPITAL_COMMUNITY): Payer: Self-pay | Admitting: Internal Medicine

## 2022-03-18 ENCOUNTER — Ambulatory Visit (HOSPITAL_BASED_OUTPATIENT_CLINIC_OR_DEPARTMENT_OTHER): Payer: Medicare PPO | Admitting: Certified Registered Nurse Anesthetist

## 2022-03-18 ENCOUNTER — Encounter (HOSPITAL_COMMUNITY)
Admission: RE | Disposition: A | Discharge status: Home / Self Care | Payer: Self-pay | Source: Home / Self Care | Attending: Internal Medicine

## 2022-03-18 DIAGNOSIS — K635 Polyp of colon: Secondary | ICD-10-CM | POA: Diagnosis not present

## 2022-03-18 DIAGNOSIS — K621 Rectal polyp: Secondary | ICD-10-CM

## 2022-03-18 DIAGNOSIS — Z1211 Encounter for screening for malignant neoplasm of colon: Secondary | ICD-10-CM

## 2022-03-18 DIAGNOSIS — I10 Essential (primary) hypertension: Secondary | ICD-10-CM | POA: Insufficient documentation

## 2022-03-18 DIAGNOSIS — Z87891 Personal history of nicotine dependence: Secondary | ICD-10-CM | POA: Insufficient documentation

## 2022-03-18 DIAGNOSIS — Z79899 Other long term (current) drug therapy: Secondary | ICD-10-CM | POA: Diagnosis not present

## 2022-03-18 DIAGNOSIS — K6289 Other specified diseases of anus and rectum: Secondary | ICD-10-CM

## 2022-03-18 DIAGNOSIS — K5730 Diverticulosis of large intestine without perforation or abscess without bleeding: Secondary | ICD-10-CM | POA: Diagnosis not present

## 2022-03-18 DIAGNOSIS — Z139 Encounter for screening, unspecified: Secondary | ICD-10-CM | POA: Diagnosis not present

## 2022-03-18 HISTORY — PX: COLONOSCOPY WITH PROPOFOL: SHX5780

## 2022-03-18 HISTORY — PX: BIOPSY: SHX5522

## 2022-03-18 LAB — HM COLONOSCOPY

## 2022-03-18 SURGERY — COLONOSCOPY WITH PROPOFOL
Anesthesia: General

## 2022-03-18 MED ORDER — PROPOFOL 10 MG/ML IV BOLUS
INTRAVENOUS | Status: DC | PRN
  Administered 2022-03-18: 60 mg via INTRAVENOUS
  Administered 2022-03-18: 40 mg via INTRAVENOUS

## 2022-03-18 MED ORDER — LACTATED RINGERS IV SOLN: INTRAVENOUS | Status: DC

## 2022-03-18 MED ORDER — PROPOFOL 500 MG/50ML IV EMUL
INTRAVENOUS | Status: DC | PRN
  Administered 2022-03-18: 150 ug/kg/min via INTRAVENOUS

## 2022-03-18 NOTE — H&P (Signed)
Cynthia Stephens is an 66 y.o. female.   ?Chief Complaint:   Patient is here for colonoscopy. ?HPI:   Patient is 66 year old Caucasian female who is here for screening colonoscopy.  Last exam was normal in December 2012.  She denies abdominal pain change in bowel habits or rectal bleeding.  Her appetite is good and her weight is stable.  She does not take aspirin or anticoagulants. ?  Family history is negative for CRC. ? ?Past Medical History:  ?Diagnosis Date  ? Hypercholesteremia   ? Hypertension   ? ? ?Past Surgical History:  ?Procedure Laterality Date  ? COLONOSCOPY  12/02/2011  ? Procedure: COLONOSCOPY;  Surgeon: Rogene Houston, MD;  Location: [facility];  Service: Endoscopy;  Laterality: N/A;  ? TUBAL LIGATION    ? ? ?Family History  ?Problem Relation Age of Onset  ? Colon cancer Neg Hx   ? ?Social History:  reports that she has quit smoking. Her smoking use included cigarettes. She has a 5.00 pack-year smoking history. She does not have any smokeless tobacco history on file. She reports current alcohol use of about 4.0 standard drinks per week. She reports that she does not use drugs. ? ?Allergies: No Known Allergies ? ?Medications Prior to Admission  ?Medication Sig Dispense Refill  ? acetaminophen (TYLENOL) 325 MG tablet Take 650 mg by mouth every 6 (six) hours as needed for moderate pain.    ? CALCIUM PO Take 1 tablet by mouth daily.    ? lisinopril-hydrochlorothiazide (PRINZIDE,ZESTORETIC) 20-25 MG per tablet Take 1 tablet by mouth daily.      ? Multiple Vitamins-Minerals (MULTIVITAMINS THER. W/MINERALS) TABS Take 1 tablet by mouth daily.      ? rosuvastatin (CRESTOR) 10 MG tablet Take 10 mg by mouth daily.    ? COVID-19 mRNA Vac-TriS, Pfizer, SUSP injection Inject into the muscle. (Patient not taking: Reported on 03/11/2022) 0.3 mL 0  ? ? ?Results for orders placed or performed during the hospital encounter of 03/16/22 (from the past 48 hour(s))  ?Basic metabolic panel     Status: Abnormal  ? Collection  Time: 03/16/22 12:58 PM  ?Result Value Ref Range  ? Sodium 138 135 - 145 mmol/L  ? Potassium 3.5 3.5 - 5.1 mmol/L  ? Chloride 102 98 - 111 mmol/L  ? CO2 28 22 - 32 mmol/L  ? Glucose, Bld 107 (H) 70 - 99 mg/dL  ?  Comment: Glucose reference range applies only to samples taken after fasting for at least 8 hours.  ? BUN 27 (H) 8 - 23 mg/dL  ? Creatinine, Ser 0.77 0.44 - 1.00 mg/dL  ? Calcium 9.0 8.9 - 10.3 mg/dL  ? GFR, Estimated >60 >60 mL/min  ?  Comment: (NOTE) ?Calculated using the CKD-EPI Creatinine Equation (2021) ?  ? Anion gap 8 5 - 15  ?  Comment: Performed at [facility], [address], [place], [place] [address]  ? ?No results found. ? ?Review of Systems ? ?There were no vitals taken for this visit. ?Physical Exam ?HENT:  ?   Mouth/Throat:  ?   Mouth: Mucous membranes are moist.  ?   Pharynx: Oropharynx is clear.  ?Eyes:  ?   General: No scleral icterus. ?   Conjunctiva/sclera: Conjunctivae normal.  ?Cardiovascular:  ?   Rate and Rhythm: Normal rate and regular rhythm.  ?   Heart sounds: Normal heart sounds. No murmur heard. ?Pulmonary:  ?   Effort: Pulmonary effort is normal.  ?  Breath sounds: Normal breath sounds.  ?Abdominal:  ?   General: There is no distension.  ?   Palpations: Abdomen is soft. There is no mass.  ?   Tenderness: There is no abdominal tenderness.  ?Musculoskeletal:     ?   General: No swelling.  ?   Cervical back: Neck supple.  ?Lymphadenopathy:  ?   Cervical: No cervical adenopathy.  ?Skin: ?   General: Skin is warm and dry.  ?Neurological:  ?   Mental Status: She is alert.  ?  ? ?Assessment/Plan ? ?  Average risk screening colonoscopy ? ?Hildred Laser, MD ?03/18/2022, 12:48 PM ? ? ? ?

## 2022-03-18 NOTE — Op Note (Signed)
[facility] ?Patient Name: Cynthia Stephens ?Procedure Date: 03/18/2022 11:31 AM ?MRN: [id number] ?Date of Birth: 06-Nov-1956 ?Attending MD: Lionel December , MD ?CSN: [id number] ?Age: 66 ?Admit Type: Outpatient ?Procedure:                Colonoscopy ?Indications:              Screening for colorectal malignant neoplasm ?Providers:                Lionel December, MD, Jannett Celestine, RN, Durwin Glaze  ?                          Tech, Technician ?Referring MD:             Catalina Pizza, MD ?Medicines:                Propofol per Anesthesia ?Complications:            No immediate complications. ?Estimated Blood Loss:     Estimated blood loss was minimal. ?Procedure:                Pre-Anesthesia Assessment: ?                          - Prior to the procedure, a History and Physical  ?                          was performed, and patient medications and  ?                          allergies were reviewed. The patient's tolerance of  ?                          previous anesthesia was also reviewed. The risks  ?                          and benefits of the procedure and the sedation  ?                          options and risks were discussed with the patient.  ?                          All questions were answered, and informed consent  ?                          was obtained. Prior Anticoagulants: The patient has  ?                          taken no previous anticoagulant or antiplatelet  ?                          agents. ASA Grade Assessment: II - A patient with  ?                          mild systemic disease. After reviewing the risks  ?  and benefits, the patient was deemed in  ?                          satisfactory condition to undergo the procedure. ?                          After obtaining informed consent, the colonoscope  ?                          was passed under direct vision. Throughout the  ?                          procedure, the patient's blood pressure, pulse, and  ?                           oxygen saturations were monitored continuously. The  ?                          [id number] ([id number]) was introduced through the  ?                          anus and advanced to the the cecum, identified by  ?                          appendiceal orifice and ileocecal valve. The  ?                          colonoscopy was performed without difficulty. The  ?                          patient tolerated the procedure well. The quality  ?                          of the bowel preparation was good. The ileocecal  ?                          valve, appendiceal orifice, and rectum were  ?                          photographed. ?Scope In: 2:10:23 PM ?Scope Out: 2:31:49 PM ?Scope Withdrawal Time: 0 hours 11 minutes 8 seconds  ?Total Procedure Duration: 0 hours 21 minutes 26 seconds  ?Findings: ?     The perianal and digital rectal examinations were normal. ?     A few diverticula were found in the sigmoid colon. ?     Three polyps were found in the rectum and recto-sigmoid colon. The  ?     polyps were small in size. These were biopsied with a cold forceps for  ?     histology. The pathology specimen was placed into Bottle Number 1. ?     Anal papilla(e) were hypertrophied. ?     No additional abnormalities were found on retroflexion. ?Impression:               - Diverticulosis in the sigmoid colon. ?                          -  Three small polyps in the rectum and at the  ?                          recto-sigmoid colon. Biopsied. ?                          - Anal papilla(e) were hypertrophied. ?Moderate Sedation: ?     Per Anesthesia Care ?Recommendation:           - Patient has a contact number available for  ?                          emergencies. The signs and symptoms of potential  ?                          delayed complications were discussed with the  ?                          patient. Return to normal activities tomorrow.  ?                          Written discharge instructions were provided to the  ?                           patient. ?                          - High fiber diet today. ?                          - Continue present medications. ?                          - No aspirin, ibuprofen, naproxen, or other  ?                          non-steroidal anti-inflammatory drugs for 1 day. ?                          - Await pathology results. ?                          - Repeat colonoscopy is recommended. The  ?                          colonoscopy date will be determined after pathology  ?                          results from today's exam become available for  ?                          review. ?Procedure Code(s):        --- Professional --- ?                          [phone], Colonoscopy, flexible; with biopsy, single  ?  or multiple ?Diagnosis Code(s):        --- Professional --- ?                          Z12.11, Encounter for screening for malignant  ?                          neoplasm of colon ?                          K62.1, Rectal polyp ?                          K63.5, Polyp of colon ?                          K62.89, Other specified diseases of anus and rectum ?                          K57.30, Diverticulosis of large intestine without  ?                          perforation or abscess without bleeding ?CPT copyright 2019 American Medical Association. All rights reserved. ?The codes documented in this report are preliminary and upon coder review may  ?be revised to meet current compliance requirements. ?Lionel December, MD ?Lionel December, MD ?03/18/2022 2:41:09 PM ?This report has been signed electronically. ?Number of Addenda: 0 ?

## 2022-03-18 NOTE — Anesthesia Preprocedure Evaluation (Signed)
Anesthesia Evaluation  Patient identified by MRN, date of birth, ID band Patient awake    Reviewed: Allergy & Precautions, H&P , NPO status , Patient's Chart, lab work & pertinent test results, reviewed documented beta blocker date and time   Airway Mallampati: II  TM Distance: >3 FB Neck ROM: full    Dental no notable dental hx.    Pulmonary neg pulmonary ROS, former smoker,    Pulmonary exam normal breath sounds clear to auscultation       Cardiovascular Exercise Tolerance: Good hypertension, negative cardio ROS   Rhythm:regular Rate:Normal     Neuro/Psych negative neurological ROS  negative psych ROS   GI/Hepatic negative GI ROS, Neg liver ROS,   Endo/Other  negative endocrine ROS  Renal/GU negative Renal ROS  negative genitourinary   Musculoskeletal   Abdominal   Peds  Hematology negative hematology ROS (+)   Anesthesia Other Findings   Reproductive/Obstetrics negative OB ROS                             Anesthesia Physical Anesthesia Plan  ASA: 2  Anesthesia Plan: General   Post-op Pain Management:    Induction:   PONV Risk Score and Plan: Propofol infusion  Airway Management Planned:   Additional Equipment:   Intra-op Plan:   Post-operative Plan:   Informed Consent: I have reviewed the patients History and Physical, chart, labs and discussed the procedure including the risks, benefits and alternatives for the proposed anesthesia with the patient or authorized representative who has indicated his/her understanding and acceptance.     Dental Advisory Given  Plan Discussed with: CRNA  Anesthesia Plan Comments:         Anesthesia Quick Evaluation  

## 2022-03-18 NOTE — Transfer of Care (Signed)
Immediate Anesthesia Transfer of Care Note ? ?Patient: Cynthia Stephens ? ?Procedure(s) Performed: COLONOSCOPY WITH PROPOFOL ?BIOPSY ? ?Patient Location: PACU ? ?Anesthesia Type:General ? ?Level of Consciousness: awake, alert  and oriented ? ?Airway & Oxygen Therapy: Patient Spontanous Breathing ? ?Post-op Assessment: Report given to RN, Post -op Vital signs reviewed and stable, Patient moving all extremities X 4 and Patient able to stick tongue midline ? ?Post vital signs: Reviewed ? ?Last Vitals:  ?Vitals Value Taken Time  ?BP 111/59   ?Temp 36.6   ?Pulse 70   ?Resp 17   ?SpO2 98   ? ? ?Last Pain:  ?Vitals:  ? 03/18/22 1233  ?TempSrc: Oral  ?PainSc: 0-No pain  ?   ? ?Patients Stated Pain Goal: 7 (03/18/22 1233) ? ?Complications: No notable events documented. ?

## 2022-03-18 NOTE — Discharge Instructions (Addendum)
No aspirin or NSAIDs for 24 hours. Resume usual medications as before. High-fiber diet. No driving for 24 hours. Physician will call with biopsy results and further recommendations.   

## 2022-03-19 DIAGNOSIS — M25562 Pain in left knee: Secondary | ICD-10-CM | POA: Diagnosis not present

## 2022-03-19 DIAGNOSIS — M1712 Unilateral primary osteoarthritis, left knee: Secondary | ICD-10-CM | POA: Diagnosis not present

## 2022-03-19 NOTE — Anesthesia Postprocedure Evaluation (Signed)
Anesthesia Post Note ? ?Patient: Cynthia Stephens ? ?Procedure(s) Performed: COLONOSCOPY WITH PROPOFOL ?BIOPSY ? ?Patient location during evaluation: Phase II ?Anesthesia Type: General ?Level of consciousness: awake ?Pain management: pain level controlled ?Vital Signs Assessment: post-procedure vital signs reviewed and stable ?Respiratory status: spontaneous breathing and respiratory function stable ?Cardiovascular status: blood pressure returned to baseline and stable ?Postop Assessment: no headache and no apparent nausea or vomiting ?Anesthetic complications: no ?Comments: Late entry ? ? ?No notable events documented. ? ? ?Last Vitals:  ?Vitals:  ? 03/18/22 1233 03/18/22 1434  ?BP: (!) 145/83 (!) 111/59  ?Pulse: 78 69  ?Resp: 20   ?Temp: 36.7 ?C 36.7 ?C  ?SpO2: 96% 98%  ?  ?Last Pain:  ?Vitals:  ? 03/18/22 1434  ?TempSrc: Oral  ?PainSc: 0-No pain  ? ? ?  ?  ?  ?  ?  ?  ? ?Windell [place] ? ? ? ? ?

## 2022-03-20 LAB — SURGICAL PATHOLOGY

## 2022-03-23 ENCOUNTER — Encounter (HOSPITAL_COMMUNITY): Payer: Self-pay | Admitting: Internal Medicine

## 2022-03-26 ENCOUNTER — Encounter (INDEPENDENT_AMBULATORY_CARE_PROVIDER_SITE_OTHER): Payer: Self-pay | Admitting: *Deleted

## 2022-04-08 DIAGNOSIS — E669 Obesity, unspecified: Secondary | ICD-10-CM | POA: Diagnosis not present

## 2022-04-08 DIAGNOSIS — R7301 Impaired fasting glucose: Secondary | ICD-10-CM | POA: Diagnosis not present

## 2022-04-08 DIAGNOSIS — M85851 Other specified disorders of bone density and structure, right thigh: Secondary | ICD-10-CM | POA: Diagnosis not present

## 2022-04-08 DIAGNOSIS — E782 Mixed hyperlipidemia: Secondary | ICD-10-CM | POA: Diagnosis not present

## 2022-04-29 DIAGNOSIS — M85851 Other specified disorders of bone density and structure, right thigh: Secondary | ICD-10-CM | POA: Diagnosis not present

## 2022-04-29 DIAGNOSIS — R7301 Impaired fasting glucose: Secondary | ICD-10-CM | POA: Diagnosis not present

## 2022-04-29 DIAGNOSIS — Z6835 Body mass index (BMI) 35.0-35.9, adult: Secondary | ICD-10-CM | POA: Diagnosis not present

## 2022-04-29 DIAGNOSIS — E669 Obesity, unspecified: Secondary | ICD-10-CM | POA: Diagnosis not present

## 2022-04-29 DIAGNOSIS — E782 Mixed hyperlipidemia: Secondary | ICD-10-CM | POA: Diagnosis not present

## 2022-04-29 DIAGNOSIS — I10 Essential (primary) hypertension: Secondary | ICD-10-CM | POA: Diagnosis not present

## 2022-05-06 IMAGING — MG MM DIGITAL SCREENING BILAT W/ TOMO AND CAD
8 series · 9 of 24 positions shown · non-contrast
Comparison: Previous exam(s).

CLINICAL DATA: Screening.

EXAM:
DIGITAL SCREENING BILATERAL MAMMOGRAM WITH TOMOSYNTHESIS AND CAD
TECHNIQUE: Bilateral screening digital craniocaudal and mediolateral oblique
mammograms were obtained. Bilateral screening digital breast
tomosynthesis was performed. The images were evaluated with
computer-aided detection.

[R CC synth-2D]
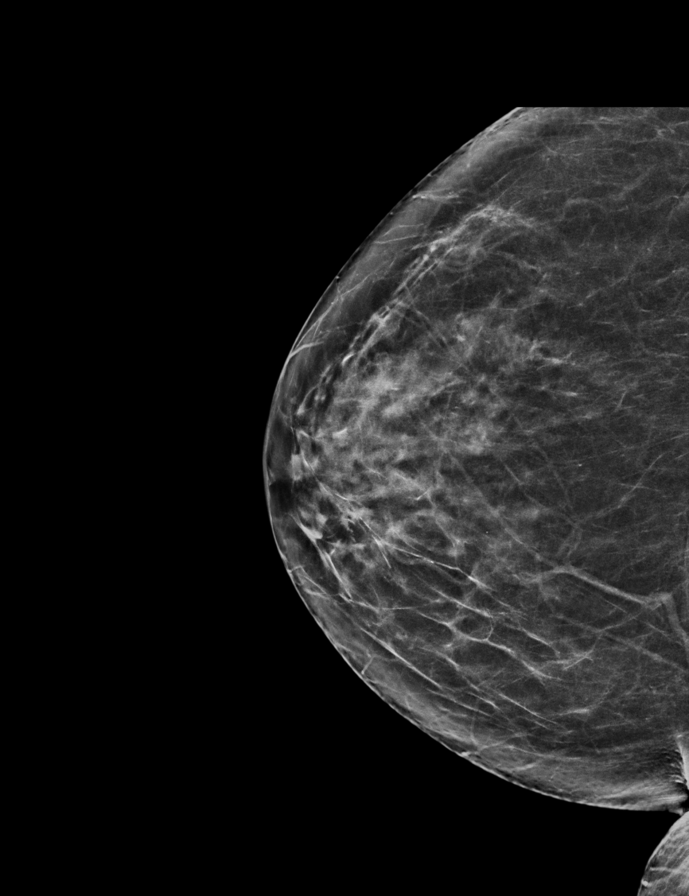

[R MLO synth-2D]
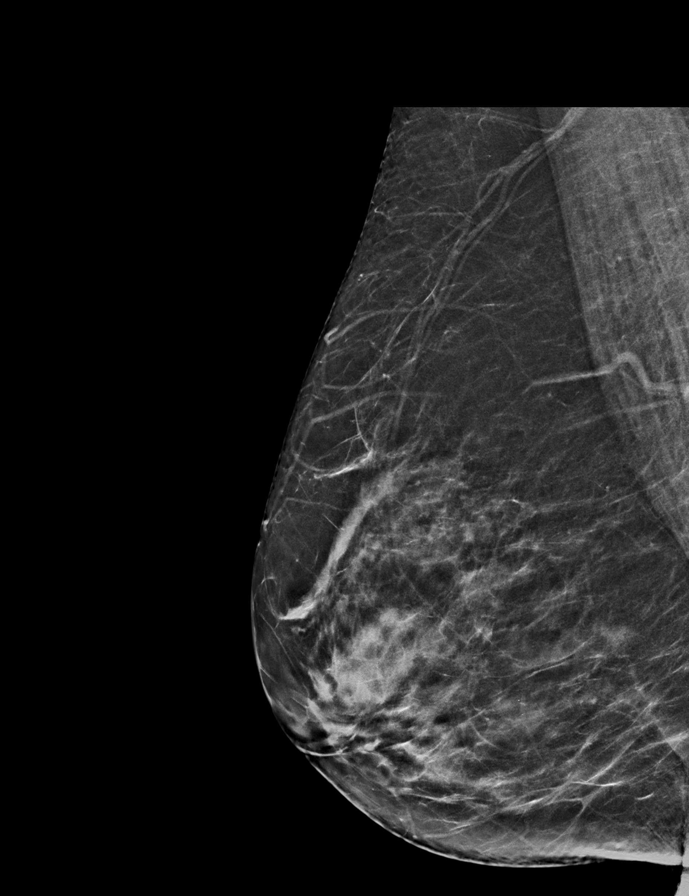

[L MLO synth-2D]
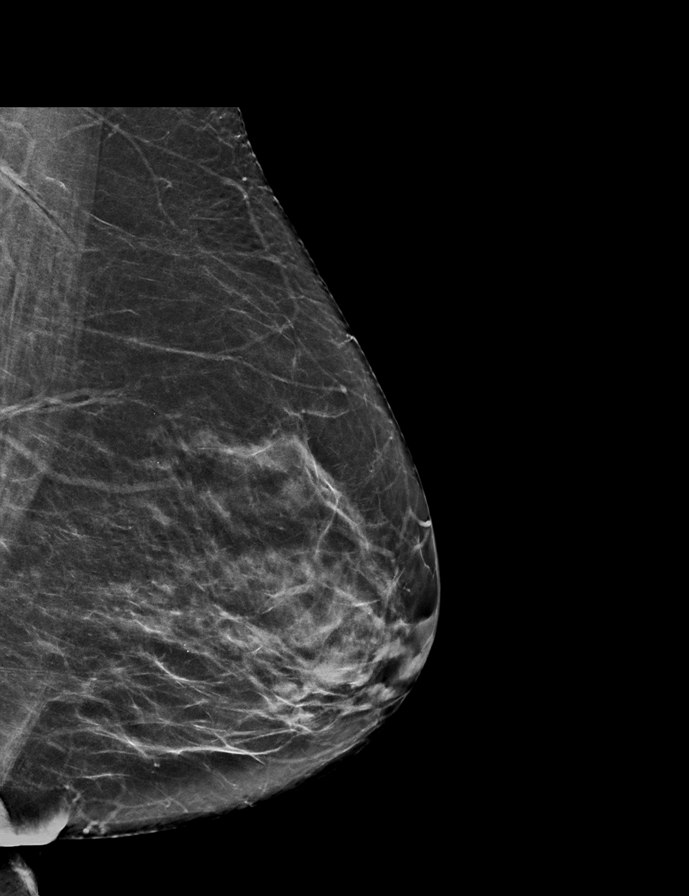

[L CC synth-2D]
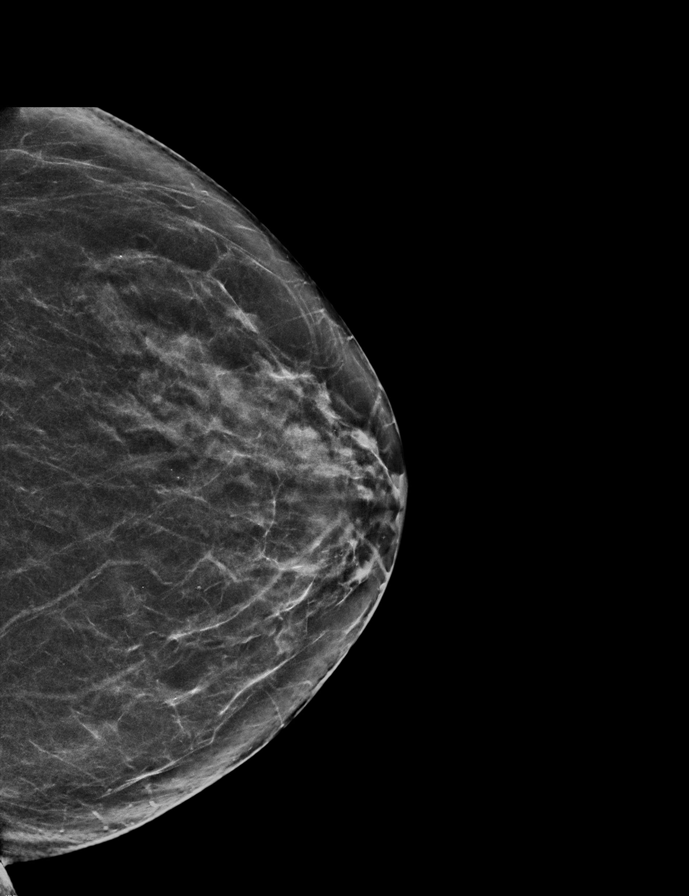

[L CC tomo · 2 of 60 frames shown]
[frame 20/60]
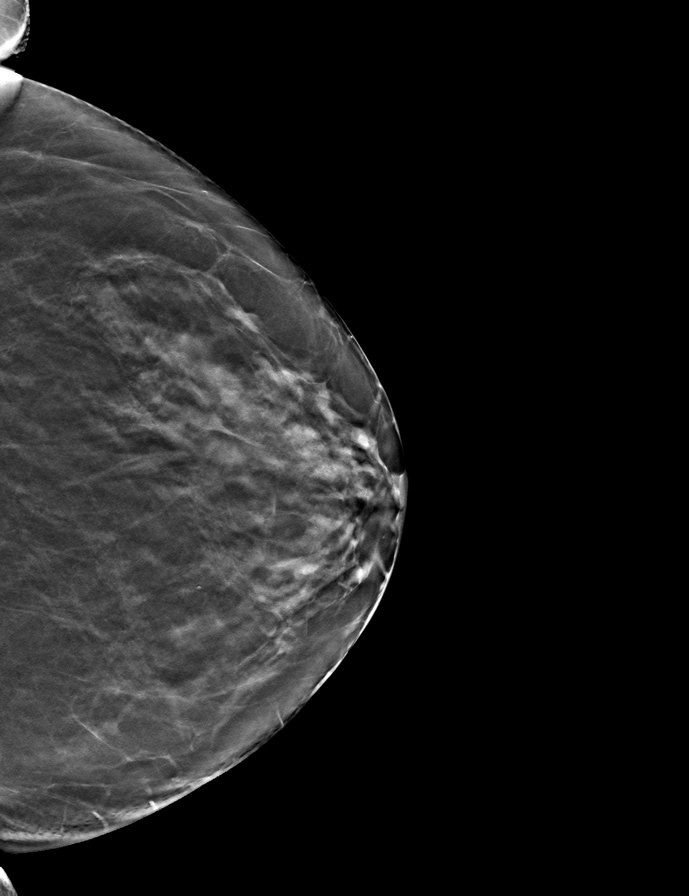
[frame 31/60]
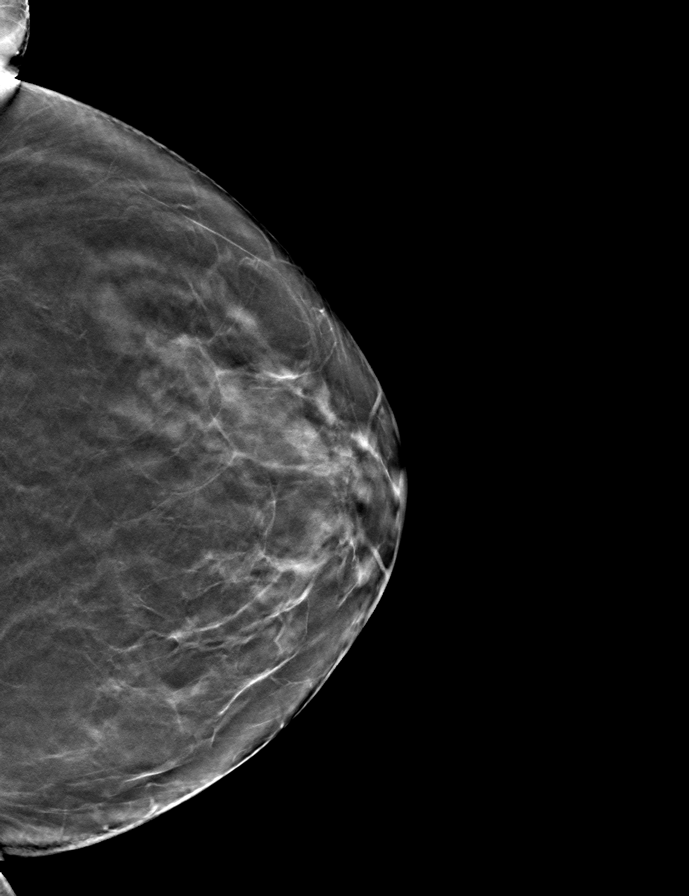

[R CC tomo · tomo slice 32/63.0]
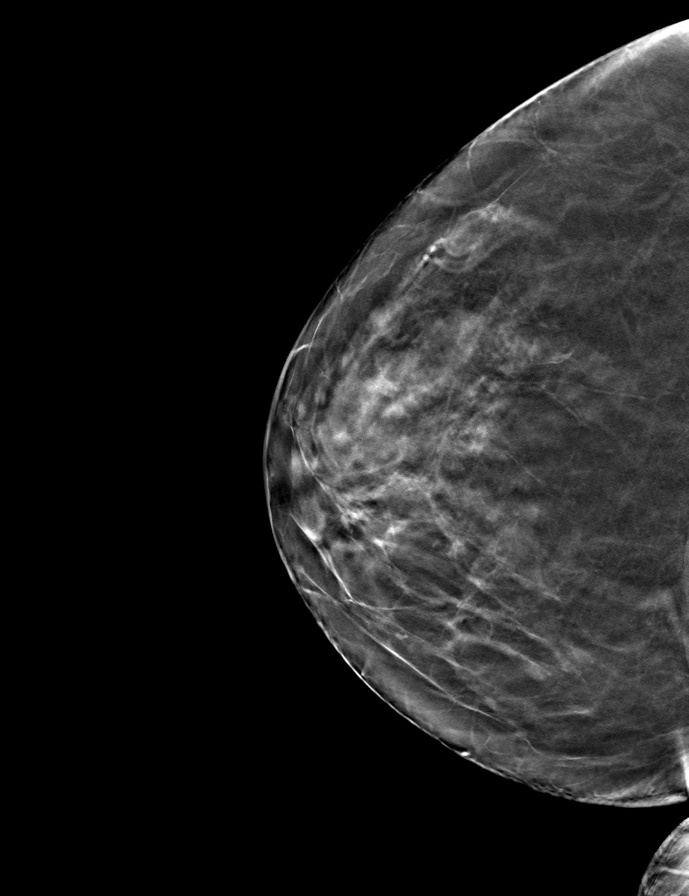

[R MLO tomo · tomo slice 32/63.0]
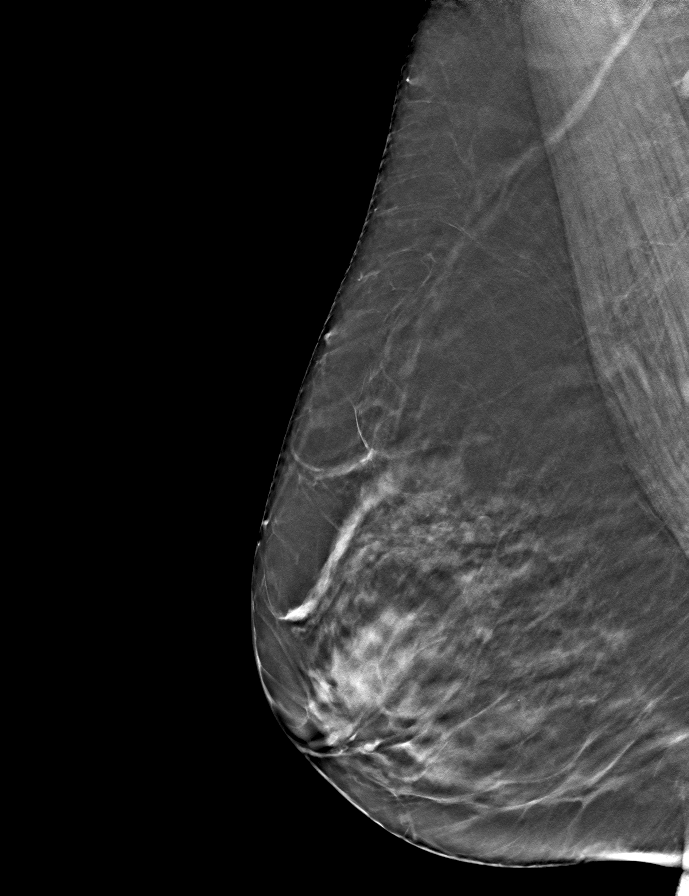

[L MLO tomo · tomo slice 33/66.0]
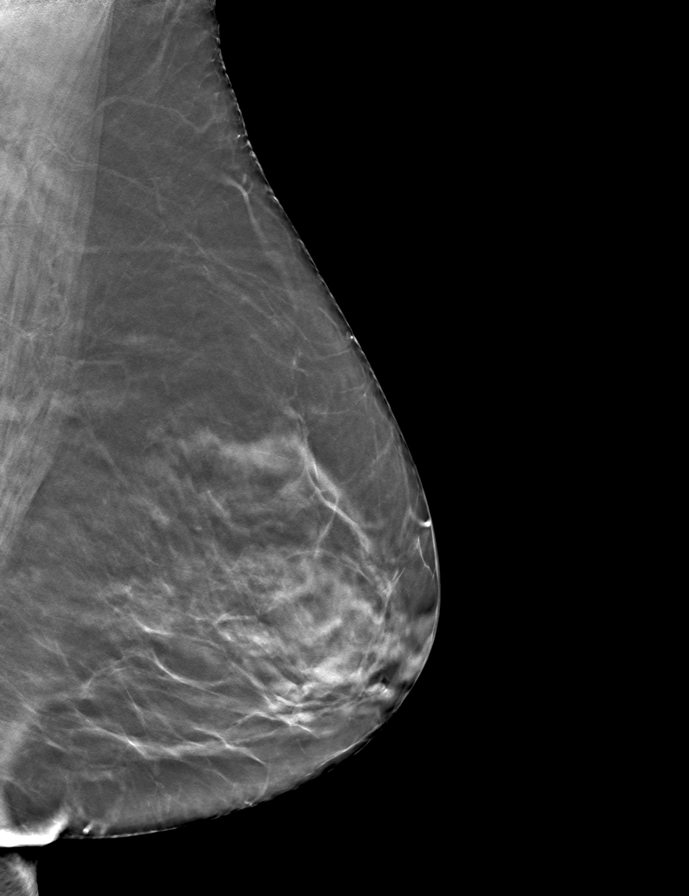

[9 of 24 positions shown; findings below may reference images not displayed]

ACR Breast Density Category c: The breast tissue is heterogeneously
dense, which may obscure small masses.
FINDINGS: There are no findings suspicious for malignancy.
IMPRESSION: No mammographic evidence of malignancy. A result letter of this
screening mammogram will be mailed directly to the patient.

RECOMMENDATION:
Screening mammogram in one year. (Code:Q3-W-BC3)

BI-RADS CATEGORY  1: Negative.

## 2022-05-20 DIAGNOSIS — H33311 Horseshoe tear of retina without detachment, right eye: Secondary | ICD-10-CM | POA: Diagnosis not present

## 2022-05-20 DIAGNOSIS — H43813 Vitreous degeneration, bilateral: Secondary | ICD-10-CM | POA: Diagnosis not present

## 2022-05-20 DIAGNOSIS — H353131 Nonexudative age-related macular degeneration, bilateral, early dry stage: Secondary | ICD-10-CM | POA: Diagnosis not present

## 2022-06-05 DIAGNOSIS — H524 Presbyopia: Secondary | ICD-10-CM | POA: Diagnosis not present

## 2022-07-01 ENCOUNTER — Other Ambulatory Visit (HOSPITAL_COMMUNITY): Payer: Self-pay | Admitting: Internal Medicine

## 2022-07-01 ENCOUNTER — Ambulatory Visit (HOSPITAL_COMMUNITY)
Admission: RE | Admit: 2022-07-01 | Discharge: 2022-07-01 | Disposition: A | Discharge status: Home / Self Care | Payer: Medicare PPO | Source: Ambulatory Visit | Attending: Internal Medicine | Admitting: Internal Medicine

## 2022-07-01 DIAGNOSIS — Z1231 Encounter for screening mammogram for malignant neoplasm of breast: Secondary | ICD-10-CM | POA: Diagnosis not present

## 2022-08-25 DIAGNOSIS — M1712 Unilateral primary osteoarthritis, left knee: Secondary | ICD-10-CM | POA: Diagnosis not present

## 2022-09-24 DIAGNOSIS — E782 Mixed hyperlipidemia: Secondary | ICD-10-CM | POA: Diagnosis not present

## 2022-09-24 DIAGNOSIS — R7301 Impaired fasting glucose: Secondary | ICD-10-CM | POA: Diagnosis not present

## 2022-09-24 DIAGNOSIS — M85851 Other specified disorders of bone density and structure, right thigh: Secondary | ICD-10-CM | POA: Diagnosis not present

## 2022-10-01 DIAGNOSIS — Z6836 Body mass index (BMI) 36.0-36.9, adult: Secondary | ICD-10-CM | POA: Diagnosis not present

## 2022-10-01 DIAGNOSIS — M25562 Pain in left knee: Secondary | ICD-10-CM | POA: Diagnosis not present

## 2022-10-01 DIAGNOSIS — Z23 Encounter for immunization: Secondary | ICD-10-CM | POA: Diagnosis not present

## 2022-10-01 DIAGNOSIS — E782 Mixed hyperlipidemia: Secondary | ICD-10-CM | POA: Diagnosis not present

## 2022-10-01 DIAGNOSIS — M85851 Other specified disorders of bone density and structure, right thigh: Secondary | ICD-10-CM | POA: Diagnosis not present

## 2022-10-01 DIAGNOSIS — R7301 Impaired fasting glucose: Secondary | ICD-10-CM | POA: Diagnosis not present

## 2022-10-01 DIAGNOSIS — E669 Obesity, unspecified: Secondary | ICD-10-CM | POA: Diagnosis not present

## 2022-10-01 DIAGNOSIS — I10 Essential (primary) hypertension: Secondary | ICD-10-CM | POA: Diagnosis not present

## 2022-10-01 DIAGNOSIS — Z0000 Encounter for general adult medical examination without abnormal findings: Secondary | ICD-10-CM | POA: Diagnosis not present

## 2023-04-29 DIAGNOSIS — R7301 Impaired fasting glucose: Secondary | ICD-10-CM | POA: Diagnosis not present

## 2023-04-29 DIAGNOSIS — E782 Mixed hyperlipidemia: Secondary | ICD-10-CM | POA: Diagnosis not present

## 2023-04-29 DIAGNOSIS — M85851 Other specified disorders of bone density and structure, right thigh: Secondary | ICD-10-CM | POA: Diagnosis not present

## 2023-04-30 DIAGNOSIS — Z0000 Encounter for general adult medical examination without abnormal findings: Secondary | ICD-10-CM | POA: Diagnosis not present

## 2023-05-03 DIAGNOSIS — R7301 Impaired fasting glucose: Secondary | ICD-10-CM | POA: Diagnosis not present

## 2023-05-03 DIAGNOSIS — E669 Obesity, unspecified: Secondary | ICD-10-CM | POA: Diagnosis not present

## 2023-05-03 DIAGNOSIS — Z79899 Other long term (current) drug therapy: Secondary | ICD-10-CM | POA: Diagnosis not present

## 2023-05-03 DIAGNOSIS — I10 Essential (primary) hypertension: Secondary | ICD-10-CM | POA: Diagnosis not present

## 2023-05-03 DIAGNOSIS — Z6838 Body mass index (BMI) 38.0-38.9, adult: Secondary | ICD-10-CM | POA: Diagnosis not present

## 2023-05-03 DIAGNOSIS — M25562 Pain in left knee: Secondary | ICD-10-CM | POA: Diagnosis not present

## 2023-05-03 DIAGNOSIS — M85851 Other specified disorders of bone density and structure, right thigh: Secondary | ICD-10-CM | POA: Diagnosis not present

## 2023-05-03 DIAGNOSIS — E782 Mixed hyperlipidemia: Secondary | ICD-10-CM | POA: Diagnosis not present

## 2023-06-30 DIAGNOSIS — I10 Essential (primary) hypertension: Secondary | ICD-10-CM | POA: Diagnosis not present

## 2023-06-30 DIAGNOSIS — E669 Obesity, unspecified: Secondary | ICD-10-CM | POA: Diagnosis not present

## 2023-06-30 DIAGNOSIS — R7301 Impaired fasting glucose: Secondary | ICD-10-CM | POA: Diagnosis not present

## 2023-06-30 DIAGNOSIS — Z713 Dietary counseling and surveillance: Secondary | ICD-10-CM | POA: Diagnosis not present

## 2023-06-30 DIAGNOSIS — E782 Mixed hyperlipidemia: Secondary | ICD-10-CM | POA: Diagnosis not present

## 2023-06-30 DIAGNOSIS — Z6837 Body mass index (BMI) 37.0-37.9, adult: Secondary | ICD-10-CM | POA: Diagnosis not present

## 2023-07-06 ENCOUNTER — Other Ambulatory Visit (HOSPITAL_COMMUNITY): Payer: Self-pay

## 2023-07-07 ENCOUNTER — Other Ambulatory Visit (HOSPITAL_COMMUNITY): Payer: Self-pay

## 2023-07-07 MED ORDER — SEMAGLUTIDE-WEIGHT MANAGEMENT 0.25 MG/0.5ML ~~LOC~~ SOAJ
0.2500 mg | SUBCUTANEOUS | 0 refills | Status: AC
  Filled 2023-07-07: qty 2, 28d supply, fill #0

## 2023-07-07 MED ORDER — SEMAGLUTIDE-WEIGHT MANAGEMENT 0.5 MG/0.5ML ~~LOC~~ SOAJ
0.5000 mg | SUBCUTANEOUS | 5 refills | Status: AC
  Filled 2023-07-29: qty 2, 28d supply, fill #0
  Filled 2023-08-31 – 2023-09-03 (×3): qty 2, 28d supply, fill #1
  Filled 2023-09-07 – 2023-09-30 (×2): qty 2, 28d supply, fill #2
  Filled 2023-10-25: qty 2, 28d supply, fill #3

## 2023-07-09 ENCOUNTER — Other Ambulatory Visit (HOSPITAL_BASED_OUTPATIENT_CLINIC_OR_DEPARTMENT_OTHER): Payer: Self-pay

## 2023-07-09 ENCOUNTER — Encounter (HOSPITAL_COMMUNITY): Payer: Self-pay

## 2023-07-09 ENCOUNTER — Other Ambulatory Visit (HOSPITAL_COMMUNITY): Payer: Self-pay

## 2023-07-12 ENCOUNTER — Other Ambulatory Visit (HOSPITAL_COMMUNITY): Payer: Self-pay

## 2023-07-26 ENCOUNTER — Other Ambulatory Visit (HOSPITAL_COMMUNITY): Payer: Self-pay

## 2023-07-29 ENCOUNTER — Other Ambulatory Visit (HOSPITAL_COMMUNITY): Payer: Self-pay

## 2023-08-02 ENCOUNTER — Encounter (HOSPITAL_COMMUNITY): Payer: Self-pay

## 2023-08-03 ENCOUNTER — Other Ambulatory Visit (HOSPITAL_COMMUNITY): Payer: Self-pay

## 2023-08-19 ENCOUNTER — Other Ambulatory Visit (HOSPITAL_COMMUNITY): Payer: Self-pay | Admitting: Internal Medicine

## 2023-08-19 DIAGNOSIS — Z1231 Encounter for screening mammogram for malignant neoplasm of breast: Secondary | ICD-10-CM

## 2023-08-26 ENCOUNTER — Encounter (HOSPITAL_COMMUNITY): Payer: Self-pay | Admitting: Radiology

## 2023-08-26 ENCOUNTER — Ambulatory Visit (HOSPITAL_COMMUNITY)
Admission: RE | Admit: 2023-08-26 | Discharge: 2023-08-26 | Disposition: A | Discharge status: Home / Self Care | Payer: Medicare PPO | Source: Ambulatory Visit | Attending: Internal Medicine | Admitting: Internal Medicine

## 2023-08-26 ENCOUNTER — Inpatient Hospital Stay (HOSPITAL_COMMUNITY): Admission: RE | Admit: 2023-08-26 | Payer: Medicare PPO | Source: Ambulatory Visit

## 2023-08-26 DIAGNOSIS — Z1231 Encounter for screening mammogram for malignant neoplasm of breast: Secondary | ICD-10-CM | POA: Insufficient documentation

## 2023-09-01 ENCOUNTER — Other Ambulatory Visit (HOSPITAL_COMMUNITY): Payer: Self-pay

## 2023-09-03 ENCOUNTER — Encounter (HOSPITAL_COMMUNITY): Payer: Self-pay

## 2023-09-03 ENCOUNTER — Other Ambulatory Visit (HOSPITAL_COMMUNITY): Payer: Self-pay

## 2023-09-07 ENCOUNTER — Other Ambulatory Visit (HOSPITAL_COMMUNITY): Payer: Self-pay

## 2023-09-30 ENCOUNTER — Other Ambulatory Visit (HOSPITAL_COMMUNITY): Payer: Self-pay

## 2023-10-01 ENCOUNTER — Other Ambulatory Visit (HOSPITAL_COMMUNITY): Payer: Self-pay

## 2023-10-26 ENCOUNTER — Other Ambulatory Visit (HOSPITAL_COMMUNITY): Payer: Self-pay

## 2023-10-28 DIAGNOSIS — E782 Mixed hyperlipidemia: Secondary | ICD-10-CM | POA: Diagnosis not present

## 2023-10-28 DIAGNOSIS — R7301 Impaired fasting glucose: Secondary | ICD-10-CM | POA: Diagnosis not present

## 2023-10-28 DIAGNOSIS — M85851 Other specified disorders of bone density and structure, right thigh: Secondary | ICD-10-CM | POA: Diagnosis not present

## 2023-11-01 ENCOUNTER — Other Ambulatory Visit: Payer: Self-pay

## 2023-11-01 ENCOUNTER — Other Ambulatory Visit (HOSPITAL_COMMUNITY): Payer: Self-pay | Admitting: Internal Medicine

## 2023-11-01 ENCOUNTER — Other Ambulatory Visit (HOSPITAL_COMMUNITY): Payer: Self-pay

## 2023-11-01 DIAGNOSIS — R7301 Impaired fasting glucose: Secondary | ICD-10-CM | POA: Diagnosis not present

## 2023-11-01 DIAGNOSIS — I10 Essential (primary) hypertension: Secondary | ICD-10-CM | POA: Diagnosis not present

## 2023-11-01 DIAGNOSIS — E782 Mixed hyperlipidemia: Secondary | ICD-10-CM | POA: Diagnosis not present

## 2023-11-01 DIAGNOSIS — M85851 Other specified disorders of bone density and structure, right thigh: Secondary | ICD-10-CM | POA: Diagnosis not present

## 2023-11-01 DIAGNOSIS — M25562 Pain in left knee: Secondary | ICD-10-CM | POA: Diagnosis not present

## 2023-11-01 DIAGNOSIS — Z23 Encounter for immunization: Secondary | ICD-10-CM | POA: Diagnosis not present

## 2023-11-01 MED ORDER — WEGOVY 1 MG/0.5ML ~~LOC~~ SOAJ
1.0000 mg | SUBCUTANEOUS | 5 refills | Status: AC
  Filled 2023-11-01: qty 2, 28d supply, fill #0
  Filled 2023-12-20: qty 2, 28d supply, fill #1
  Filled 2024-01-16: qty 2, 28d supply, fill #2
  Filled 2024-02-13: qty 2, 28d supply, fill #3
  Filled 2024-03-15: qty 2, 28d supply, fill #4
  Filled 2024-04-11: qty 2, 28d supply, fill #5

## 2023-11-05 ENCOUNTER — Other Ambulatory Visit (HOSPITAL_COMMUNITY): Payer: Self-pay

## 2023-11-05 ENCOUNTER — Ambulatory Visit (HOSPITAL_COMMUNITY)
Admission: RE | Admit: 2023-11-05 | Discharge: 2023-11-05 | Disposition: A | Discharge status: Home / Self Care | Payer: Medicare PPO | Source: Ambulatory Visit | Attending: Internal Medicine | Admitting: Internal Medicine

## 2023-11-05 ENCOUNTER — Encounter (HOSPITAL_COMMUNITY): Payer: Self-pay

## 2023-11-05 DIAGNOSIS — M85851 Other specified disorders of bone density and structure, right thigh: Secondary | ICD-10-CM | POA: Diagnosis not present

## 2023-11-15 DIAGNOSIS — M1990 Unspecified osteoarthritis, unspecified site: Secondary | ICD-10-CM | POA: Diagnosis not present

## 2023-11-15 DIAGNOSIS — M8580 Other specified disorders of bone density and structure, unspecified site: Secondary | ICD-10-CM | POA: Diagnosis not present

## 2023-11-15 DIAGNOSIS — K219 Gastro-esophageal reflux disease without esophagitis: Secondary | ICD-10-CM | POA: Diagnosis not present

## 2023-11-15 DIAGNOSIS — I129 Hypertensive chronic kidney disease with stage 1 through stage 4 chronic kidney disease, or unspecified chronic kidney disease: Secondary | ICD-10-CM | POA: Diagnosis not present

## 2023-11-15 DIAGNOSIS — Z8249 Family history of ischemic heart disease and other diseases of the circulatory system: Secondary | ICD-10-CM | POA: Diagnosis not present

## 2023-11-15 DIAGNOSIS — Z87891 Personal history of nicotine dependence: Secondary | ICD-10-CM | POA: Diagnosis not present

## 2023-11-15 DIAGNOSIS — E669 Obesity, unspecified: Secondary | ICD-10-CM | POA: Diagnosis not present

## 2023-11-15 DIAGNOSIS — N182 Chronic kidney disease, stage 2 (mild): Secondary | ICD-10-CM | POA: Diagnosis not present

## 2023-11-15 DIAGNOSIS — E785 Hyperlipidemia, unspecified: Secondary | ICD-10-CM | POA: Diagnosis not present

## 2023-12-20 ENCOUNTER — Other Ambulatory Visit (HOSPITAL_COMMUNITY): Payer: Self-pay

## 2023-12-21 ENCOUNTER — Other Ambulatory Visit (HOSPITAL_COMMUNITY): Payer: Self-pay

## 2024-01-17 ENCOUNTER — Other Ambulatory Visit (HOSPITAL_COMMUNITY): Payer: Self-pay

## 2024-02-14 ENCOUNTER — Other Ambulatory Visit (HOSPITAL_COMMUNITY): Payer: Self-pay

## 2024-02-15 ENCOUNTER — Other Ambulatory Visit (HOSPITAL_COMMUNITY): Payer: Self-pay

## 2024-03-16 ENCOUNTER — Other Ambulatory Visit (HOSPITAL_COMMUNITY): Payer: Self-pay

## 2024-04-11 ENCOUNTER — Other Ambulatory Visit (HOSPITAL_COMMUNITY): Payer: Self-pay

## 2024-04-26 DIAGNOSIS — M85851 Other specified disorders of bone density and structure, right thigh: Secondary | ICD-10-CM | POA: Diagnosis not present

## 2024-04-26 DIAGNOSIS — E782 Mixed hyperlipidemia: Secondary | ICD-10-CM | POA: Diagnosis not present

## 2024-04-26 DIAGNOSIS — R7301 Impaired fasting glucose: Secondary | ICD-10-CM | POA: Diagnosis not present

## 2024-05-01 ENCOUNTER — Other Ambulatory Visit: Payer: Self-pay

## 2024-05-01 ENCOUNTER — Other Ambulatory Visit (HOSPITAL_COMMUNITY): Payer: Self-pay

## 2024-05-01 DIAGNOSIS — R7301 Impaired fasting glucose: Secondary | ICD-10-CM | POA: Diagnosis not present

## 2024-05-01 DIAGNOSIS — E782 Mixed hyperlipidemia: Secondary | ICD-10-CM | POA: Diagnosis not present

## 2024-05-01 DIAGNOSIS — Z6833 Body mass index (BMI) 33.0-33.9, adult: Secondary | ICD-10-CM | POA: Diagnosis not present

## 2024-05-01 DIAGNOSIS — M85851 Other specified disorders of bone density and structure, right thigh: Secondary | ICD-10-CM | POA: Diagnosis not present

## 2024-05-01 DIAGNOSIS — I10 Essential (primary) hypertension: Secondary | ICD-10-CM | POA: Diagnosis not present

## 2024-05-01 MED ORDER — WEGOVY 1.7 MG/0.75ML ~~LOC~~ SOAJ
1.7000 mg | SUBCUTANEOUS | 0 refills | Status: AC
  Filled 2024-05-01: qty 3, 28d supply, fill #0

## 2024-05-01 MED ORDER — WEGOVY 2.4 MG/0.75ML ~~LOC~~ SOAJ
2.4000 mg | SUBCUTANEOUS | 5 refills | Status: AC
  Filled 2024-06-12: qty 3, 28d supply, fill #0
  Filled 2024-07-03: qty 3, 28d supply, fill #1
  Filled 2024-08-03 (×2): qty 3, 28d supply, fill #2
  Filled 2024-09-05: qty 3, 28d supply, fill #3
  Filled 2024-10-03: qty 3, 28d supply, fill #4

## 2024-06-12 ENCOUNTER — Other Ambulatory Visit (HOSPITAL_COMMUNITY): Payer: Self-pay

## 2024-07-03 ENCOUNTER — Other Ambulatory Visit (HOSPITAL_COMMUNITY): Payer: Self-pay

## 2024-07-07 ENCOUNTER — Encounter: Payer: Self-pay | Admitting: Advanced Practice Midwife

## 2024-08-03 ENCOUNTER — Other Ambulatory Visit (HOSPITAL_COMMUNITY): Payer: Self-pay

## 2024-09-05 ENCOUNTER — Other Ambulatory Visit (HOSPITAL_COMMUNITY): Payer: Self-pay

## 2024-09-18 ENCOUNTER — Other Ambulatory Visit (HOSPITAL_COMMUNITY): Payer: Self-pay | Admitting: Internal Medicine

## 2024-09-18 DIAGNOSIS — Z1231 Encounter for screening mammogram for malignant neoplasm of breast: Secondary | ICD-10-CM

## 2024-09-20 ENCOUNTER — Encounter (HOSPITAL_COMMUNITY): Payer: Self-pay

## 2024-09-20 ENCOUNTER — Ambulatory Visit (HOSPITAL_COMMUNITY)
Admission: RE | Admit: 2024-09-20 | Discharge: 2024-09-20 | Disposition: A | Discharge status: Home / Self Care | Source: Ambulatory Visit | Attending: Internal Medicine | Admitting: Internal Medicine

## 2024-09-20 DIAGNOSIS — Z1231 Encounter for screening mammogram for malignant neoplasm of breast: Secondary | ICD-10-CM | POA: Insufficient documentation

## 2024-10-03 ENCOUNTER — Other Ambulatory Visit (HOSPITAL_COMMUNITY): Payer: Self-pay

## 2024-10-25 DIAGNOSIS — E782 Mixed hyperlipidemia: Secondary | ICD-10-CM | POA: Diagnosis not present

## 2024-10-25 DIAGNOSIS — M85851 Other specified disorders of bone density and structure, right thigh: Secondary | ICD-10-CM | POA: Diagnosis not present

## 2024-10-25 DIAGNOSIS — R7301 Impaired fasting glucose: Secondary | ICD-10-CM | POA: Diagnosis not present

## 2024-11-01 ENCOUNTER — Other Ambulatory Visit (HOSPITAL_COMMUNITY): Payer: Self-pay

## 2024-11-01 DIAGNOSIS — Z6833 Body mass index (BMI) 33.0-33.9, adult: Secondary | ICD-10-CM | POA: Diagnosis not present

## 2024-11-01 DIAGNOSIS — R7301 Impaired fasting glucose: Secondary | ICD-10-CM | POA: Diagnosis not present

## 2024-11-01 DIAGNOSIS — M85851 Other specified disorders of bone density and structure, right thigh: Secondary | ICD-10-CM | POA: Diagnosis not present

## 2024-11-01 DIAGNOSIS — Z7985 Long-term (current) use of injectable non-insulin antidiabetic drugs: Secondary | ICD-10-CM | POA: Diagnosis not present

## 2024-11-01 DIAGNOSIS — I10 Essential (primary) hypertension: Secondary | ICD-10-CM | POA: Diagnosis not present

## 2024-11-01 DIAGNOSIS — E782 Mixed hyperlipidemia: Secondary | ICD-10-CM | POA: Diagnosis not present

## 2024-11-01 DIAGNOSIS — Z0001 Encounter for general adult medical examination with abnormal findings: Secondary | ICD-10-CM | POA: Diagnosis not present

## 2024-11-01 DIAGNOSIS — Z0000 Encounter for general adult medical examination without abnormal findings: Secondary | ICD-10-CM | POA: Diagnosis not present

## 2024-11-01 MED ORDER — WEGOVY 2.4 MG/0.75ML ~~LOC~~ SOAJ
2.4000 mg | SUBCUTANEOUS | 5 refills | Status: AC
  Filled 2024-11-01: qty 3, 28d supply, fill #0
  Filled 2024-11-28: qty 3, 28d supply, fill #1
  Filled 2024-12-26: qty 3, 28d supply, fill #2
  Filled 2025-01-22: qty 3, 28d supply, fill #3

## 2024-11-28 ENCOUNTER — Other Ambulatory Visit (HOSPITAL_COMMUNITY): Payer: Self-pay

## 2024-12-26 ENCOUNTER — Other Ambulatory Visit (HOSPITAL_COMMUNITY): Payer: Self-pay

## 2025-01-22 ENCOUNTER — Other Ambulatory Visit (HOSPITAL_COMMUNITY): Payer: Self-pay
